# Patient Record
Sex: Female | Born: 1970 | Race: Black or African American | Hispanic: No | Marital: Married | Smoking: Former smoker
Health system: Southern US, Community
[De-identification: ages and names within clinical notes are randomized; demographics above are authoritative.]

## PROBLEM LIST (undated history)

## (undated) DIAGNOSIS — G43909 Migraine, unspecified, not intractable, without status migrainosus: Secondary | ICD-10-CM

## (undated) DIAGNOSIS — IMO0002 Reserved for concepts with insufficient information to code with codable children: Secondary | ICD-10-CM

## (undated) DIAGNOSIS — I10 Essential (primary) hypertension: Secondary | ICD-10-CM

## (undated) DIAGNOSIS — C801 Malignant (primary) neoplasm, unspecified: Secondary | ICD-10-CM

## (undated) DIAGNOSIS — N289 Disorder of kidney and ureter, unspecified: Secondary | ICD-10-CM

## (undated) DIAGNOSIS — R011 Cardiac murmur, unspecified: Secondary | ICD-10-CM

## (undated) HISTORY — PX: APPENDECTOMY: SHX54

## (undated) HISTORY — PX: CARPAL TUNNEL RELEASE: SHX101

---

## 2005-04-20 ENCOUNTER — Ambulatory Visit: Payer: Self-pay | Admitting: General Practice

## 2009-05-16 ENCOUNTER — Emergency Department: Payer: Self-pay | Admitting: Emergency Medicine

## 2010-09-20 ENCOUNTER — Emergency Department: Payer: Self-pay | Admitting: Emergency Medicine

## 2013-10-13 ENCOUNTER — Observation Stay: Payer: Self-pay | Admitting: Surgery

## 2013-10-13 LAB — CBC WITH DIFFERENTIAL/PLATELET
Basophil #: 0.1 10*3/uL (ref 0.0–0.1)
Basophil %: 0.8 %
EOS PCT: 3.3 %
Eosinophil #: 0.3 10*3/uL (ref 0.0–0.7)
HCT: 41.3 % (ref 35.0–47.0)
HGB: 13.2 g/dL (ref 12.0–16.0)
Lymphocyte #: 3.3 10*3/uL (ref 1.0–3.6)
Lymphocyte %: 40.2 %
MCH: 22.1 pg — ABNORMAL LOW (ref 26.0–34.0)
MCHC: 31.9 g/dL — ABNORMAL LOW (ref 32.0–36.0)
MCV: 69 fL — ABNORMAL LOW (ref 80–100)
MONO ABS: 0.7 x10 3/mm (ref 0.2–0.9)
MONOS PCT: 8.2 %
NEUTROS ABS: 3.9 10*3/uL (ref 1.4–6.5)
Neutrophil %: 47.5 %
Platelet: 159 10*3/uL (ref 150–440)
RBC: 5.97 10*6/uL — ABNORMAL HIGH (ref 3.80–5.20)
RDW: 13.9 % (ref 11.5–14.5)
WBC: 8.2 10*3/uL (ref 3.6–11.0)

## 2013-10-13 LAB — URINALYSIS, COMPLETE
BACTERIA: NONE SEEN
BILIRUBIN, UR: NEGATIVE
Blood: NEGATIVE
GLUCOSE, UR: NEGATIVE mg/dL (ref 0–75)
Ketone: NEGATIVE
Leukocyte Esterase: NEGATIVE
NITRITE: NEGATIVE
PH: 7 (ref 4.5–8.0)
Protein: NEGATIVE
RBC,UR: NONE SEEN /HPF (ref 0–5)
Specific Gravity: 1.01 (ref 1.003–1.030)
Squamous Epithelial: 2
WBC UR: <1 /HPF (ref 0–5)

## 2013-10-13 LAB — COMPREHENSIVE METABOLIC PANEL
ALBUMIN: 3.5 g/dL (ref 3.4–5.0)
ALT: 31 U/L (ref 12–78)
ANION GAP: 7 (ref 7–16)
Alkaline Phosphatase: 76 U/L
BUN: 18 mg/dL (ref 7–18)
Bilirubin,Total: 0.3 mg/dL (ref 0.2–1.0)
CALCIUM: 8.4 mg/dL — AB (ref 8.5–10.1)
CO2: 29 mmol/L (ref 21–32)
Chloride: 103 mmol/L (ref 98–107)
Creatinine: 1.88 mg/dL — ABNORMAL HIGH (ref 0.60–1.30)
EGFR (Non-African Amer.): 32 — ABNORMAL LOW
GFR CALC AF AMER: 37 — AB
Glucose: 101 mg/dL — ABNORMAL HIGH (ref 65–99)
Osmolality: 280 (ref 275–301)
Potassium: 3.5 mmol/L (ref 3.5–5.1)
SGOT(AST): 37 U/L (ref 15–37)
Sodium: 139 mmol/L (ref 136–145)
Total Protein: 7.1 g/dL (ref 6.4–8.2)

## 2013-10-13 LAB — LIPASE, BLOOD: LIPASE: 116 U/L (ref 73–393)

## 2013-10-14 LAB — CBC WITH DIFFERENTIAL/PLATELET
BASOS PCT: 0.6 %
Basophil #: 0 10*3/uL (ref 0.0–0.1)
Eosinophil #: 0.1 10*3/uL (ref 0.0–0.7)
Eosinophil %: 2.4 %
HCT: 38.9 % (ref 35.0–47.0)
HGB: 12.2 g/dL (ref 12.0–16.0)
LYMPHS ABS: 2.1 10*3/uL (ref 1.0–3.6)
Lymphocyte %: 37.9 %
MCH: 22.1 pg — ABNORMAL LOW (ref 26.0–34.0)
MCHC: 31.4 g/dL — ABNORMAL LOW (ref 32.0–36.0)
MCV: 70 fL — ABNORMAL LOW (ref 80–100)
Monocyte #: 0.5 x10 3/mm (ref 0.2–0.9)
Monocyte %: 8.5 %
Neutrophil #: 2.7 10*3/uL (ref 1.4–6.5)
Neutrophil %: 50.6 %
Platelet: 128 10*3/uL — ABNORMAL LOW (ref 150–440)
RBC: 5.53 10*6/uL — ABNORMAL HIGH (ref 3.80–5.20)
RDW: 13.9 % (ref 11.5–14.5)
WBC: 5.4 10*3/uL (ref 3.6–11.0)

## 2013-10-14 LAB — BASIC METABOLIC PANEL
ANION GAP: 5 — AB (ref 7–16)
BUN: 18 mg/dL (ref 7–18)
CO2: 31 mmol/L (ref 21–32)
Calcium, Total: 8 mg/dL — ABNORMAL LOW (ref 8.5–10.1)
Chloride: 103 mmol/L (ref 98–107)
Creatinine: 1.79 mg/dL — ABNORMAL HIGH (ref 0.60–1.30)
EGFR (African American): 40 — ABNORMAL LOW
EGFR (Non-African Amer.): 34 — ABNORMAL LOW
Glucose: 108 mg/dL — ABNORMAL HIGH (ref 65–99)
Osmolality: 280 (ref 275–301)
Potassium: 3.7 mmol/L (ref 3.5–5.1)
Sodium: 139 mmol/L (ref 136–145)

## 2013-10-16 LAB — PATHOLOGY REPORT

## 2014-03-26 ENCOUNTER — Emergency Department: Payer: Self-pay | Admitting: Emergency Medicine

## 2014-03-26 LAB — COMPREHENSIVE METABOLIC PANEL
ALK PHOS: 90 U/L
ALT: 21 U/L
ANION GAP: 7 (ref 7–16)
Albumin: 3.7 g/dL (ref 3.4–5.0)
BILIRUBIN TOTAL: 0.2 mg/dL (ref 0.2–1.0)
BUN: 18 mg/dL (ref 7–18)
CALCIUM: 8.4 mg/dL — AB (ref 8.5–10.1)
CO2: 34 mmol/L — AB (ref 21–32)
CREATININE: 1.79 mg/dL — AB (ref 0.60–1.30)
Chloride: 99 mmol/L (ref 98–107)
GFR CALC AF AMER: 40 — AB
GFR CALC NON AF AMER: 33 — AB
Glucose: 96 mg/dL (ref 65–99)
OSMOLALITY: 281 (ref 275–301)
Potassium: 3.4 mmol/L — ABNORMAL LOW (ref 3.5–5.1)
SGOT(AST): 21 U/L (ref 15–37)
Sodium: 140 mmol/L (ref 136–145)
Total Protein: 7.4 g/dL (ref 6.4–8.2)

## 2014-03-26 LAB — CBC WITH DIFFERENTIAL/PLATELET
BASOS ABS: 0 10*3/uL (ref 0.0–0.1)
Basophil %: 0.6 %
EOS PCT: 1.4 %
Eosinophil #: 0.1 10*3/uL (ref 0.0–0.7)
HCT: 42.6 % (ref 35.0–47.0)
HGB: 13.4 g/dL (ref 12.0–16.0)
Lymphocyte #: 3.8 10*3/uL — ABNORMAL HIGH (ref 1.0–3.6)
Lymphocyte %: 50.1 %
MCH: 22.4 pg — AB (ref 26.0–34.0)
MCHC: 31.5 g/dL — AB (ref 32.0–36.0)
MCV: 71 fL — ABNORMAL LOW (ref 80–100)
MONOS PCT: 8.7 %
Monocyte #: 0.7 x10 3/mm (ref 0.2–0.9)
NEUTROS PCT: 39.2 %
Neutrophil #: 3 10*3/uL (ref 1.4–6.5)
Platelet: 163 10*3/uL (ref 150–440)
RBC: 6.01 10*6/uL — ABNORMAL HIGH (ref 3.80–5.20)
RDW: 13.5 % (ref 11.5–14.5)
WBC: 7.6 10*3/uL (ref 3.6–11.0)

## 2014-03-26 LAB — URINALYSIS, COMPLETE
BLOOD: NEGATIVE
Bacteria: NONE SEEN
Bilirubin,UR: NEGATIVE
Glucose,UR: NEGATIVE mg/dL (ref 0–75)
KETONE: NEGATIVE
Leukocyte Esterase: NEGATIVE
NITRITE: NEGATIVE
Ph: 6 (ref 4.5–8.0)
Protein: NEGATIVE
RBC, UR: NONE SEEN /HPF (ref 0–5)
Specific Gravity: 1.011 (ref 1.003–1.030)
Squamous Epithelial: 2
WBC UR: NONE SEEN /HPF (ref 0–5)

## 2014-03-27 LAB — GC/CHLAMYDIA PROBE AMP

## 2014-03-27 LAB — WET PREP, GENITAL

## 2014-09-12 NOTE — Op Note (Signed)
PATIENT NAMJeannie Little:  Johannes, Camren L MR#:  [id number] DATE OF BIRTH:  1970/12/17  DATE OF PROCEDURE:  10/14/2013  PREOPERATIVE DIAGNOSIS: Acute appendicitis.   POSTOPERATIVE DIAGNOSIS: Acute appendicitis.   PROCEDURE: Laparoscopic appendectomy.   SURGEON: Richard E. Excell Seltzerooper, M.D.   ANESTHESIA: General with endotracheal tube.   INDICATIONS: This is a patient with progressive right lower quadrant pain and tenderness with signs of peritoneal irritation. Preoperatively, we discussed rationale for surgery, the options of observation, risk of bleeding, infection, negative laparoscopy and open procedure and alternative causes. This was all reviewed for her. She understood and agreed to proceed.   FINDINGS: Acute appendicitis in a retrocecal position. The appendix was deeply invested within the peritoneal reflection of the right colon but was obviously inflamed.   DESCRIPTION OF PROCEDURE: The patient was induced to general anesthesia, given IV antibiotics. VTE prophylaxis was in place. She was prepped and draped in a sterile fashion. A Foley catheter had been placed as well. Marcaine was infiltrated in skin and subcutaneous tissues around the periumbilical area.   Incision was made. Veress needle was placed. Pneumoperitoneum was obtained and a 5 mm trocar port was placed. The abdominal cavity was explored under direct vision and a 12 mm left lateral port was placed and a 5 mm suprapubic port was placed. The appendix could not be easily identified but the base of the appendix was identified ultimately at the cecal appendiceal juncture and it was noted that there was inflammation on the lateral side of the cecum suggestive of a retrocecal appendix with appendicitis. Sharp dissection was performed to take down thick investing peritoneal reflection of the right colon, allowing for visualization of an inflamed and indurated appendix. The base of the appendix was divided with a standard load Endo GIA and then 2 firings  of a vascular load Endo GIA was utilized across the mesoappendix and it was passed out through the lateral port site with the aid of an Endo Catch bag. It was opened and inspected and found to be acute appendicitis.   The area was checked for hemostasis and found to be adequate. Irrigation was performed and again hemostasis was found to be adequate. Therefore at this point, the left lateral port site was closed with multiple simple sutures of 0 Vicryl utilizing an Endo Close technique under direct vision. Then again hemostasis checked and found to be adequate. Pneumoperitoneum was released. All ports were removed. A 4-0 subcuticular Monocryl was used on all skin edges. Steri-Strips, Mastisol and sterile dressings were placed. The patient tolerated the procedure well. There were no complications. She was taken to the recovery room in stable condition to be admitted for continued care.    ____________________________ Adah Salvageichard E. Excell Seltzerooper, MD rec:cs D: 10/14/2013 15:57:55 ET T: 10/14/2013 19:13:04 ET JOB#: [id number]  cc: Adah Salvageichard E. Excell Seltzerooper, MD, <Dictator> Lattie HawICHARD E COOPER MD ELECTRONICALLY SIGNED 10/15/2013 7:45

## 2014-09-12 NOTE — H&P (Signed)
PATIENT NAME:  Becky Little, Becky Little MR#:  [id number] DATE OF BIRTH:  [birth year]  DATE OF ADMISSION:  10/13/2013  [facility]   ADMITTING PHYSICIAN:  Dr. Michela PitcherEly.   CHIEF COMPLAINT:  Abdominal pain.   BRIEF HISTORY:  Becky Little is a 44 year old woman seen in the Emergency Room with approximately a 10-hour history of abdominal pain. She noticed some slight right lower quadrant abdominal pain yesterday, but had no problems over the course of the day. She ate normally at lunch today. Approximately 2:00 to 3:00 in the afternoon, she developed sudden onset of pain in the right lower quadrant, which did not radiate. It was not accompanied by any nausea or vomiting. She had no fever or chills. The pain became so severe she presented to the Emergency Room for further evaluation. She denies any nausea, vomiting, diarrhea, or constipation. She had a similar episode of pain 3 years ago, [facility]. The pain resolved spontaneously, and they did not have a diagnosis for her at that time. She denies any other significant GI problems. Specifically, she has no history of hepatitis, yellow jaundice, pancreatitis, peptic ulcer disease, gallbladder disease, or diverticulitis. She has had no abdominal surgery. She denies any GYN symptoms. She has no cardiac history of note. She is hypertensive and denies diabetes or hypothyroidism. She has a history of depression and migraine headaches. 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 [facility].   ALLERGIES:  She has no medical allergies.  MEDICATIONS:  Include BuSpar 150 mg/12 hours once a day, divalproex 1000 mg once a day, iron 325, omeprazole 20 mg once a day, propranolol 80 mg once a day, and Simvastatin 10 mg once a day.   SOCIAL HISTORY:  She is not a cigarette smoker, but  did smoke formerly. She does not drink alcohol regularly.   PHYSICAL EXAMINATION:  GENERAL:  She is an alert, pleasant woman accompanied by significant other.  VITAL SIGNS:  Stable. Blood pressure 112/60, heart rate 74 and regular. She is afebrile.  HEENT:  There is no scleral icterus. No pupillary abnormalities. No facial deformities.  NECK:  Supple, nontender, with normal midline trachea. No adenopathy.  CHEST:  Clear with no adventitious sounds, and she has normal pulmonary excursion. She does have some pleuritic chest pain.  CARDIAC:  No murmurs or gallops to my ear, and seems to be in normal sinus rhythm.  ABDOMEN:  Essentially soft with no significant point tenderness. She has some very mild tenderness in the right lower quadrant with no guarding or rebound. She has active bowel sounds. No hernias are noted.  EXTREMITIES:  Full range of motion. No deformities.  PSYCHIATRIC:  Normal orientation, normal affect.   ASSESSMENT AND PLAN:  CT scan demonstrated an upper limit of normal sized appendix with possible stranding consistent with possible early appendicitis.I have independently reviewed her CT scan and the report. She does not have an elevated white blood cell count, and her clinical picture at 10 hours does not suggest acute appendicitis. She has been given antibiotics in the Emergency Room. We will admit her to the hospital, place her on IV rehydration, evaluate her continued creatinine elevation, and do serial examinations. We will re-evaluate her for laparoscopy in the morning. This plan has been discussed with the patient, and she is in agreement.  ____________________________ Carmie End, MD rle:ms D: 10/13/2013 21:28:55 ET T: 10/13/2013 22:25:52 ET JOB#: [id number]  cc: Carmie End, MD, <Dictator> [facility] Quentin Ore MD ELECTRONICALLY SIGNED 10/14/2013 20:33

## 2016-01-23 ENCOUNTER — Emergency Department
Admission: EM | Admit: 2016-01-23 | Discharge: 2016-01-23 | Disposition: A | Discharge status: Home / Self Care | Payer: Medicare Other | Attending: Emergency Medicine | Admitting: Emergency Medicine

## 2016-01-23 ENCOUNTER — Emergency Department: Payer: Medicare Other

## 2016-01-23 DIAGNOSIS — I10 Essential (primary) hypertension: Secondary | ICD-10-CM | POA: Insufficient documentation

## 2016-01-23 DIAGNOSIS — S46911A Strain of unspecified muscle, fascia and tendon at shoulder and upper arm level, right arm, initial encounter: Secondary | ICD-10-CM | POA: Diagnosis not present

## 2016-01-23 DIAGNOSIS — Y939 Activity, unspecified: Secondary | ICD-10-CM | POA: Diagnosis not present

## 2016-01-23 DIAGNOSIS — F17200 Nicotine dependence, unspecified, uncomplicated: Secondary | ICD-10-CM | POA: Diagnosis not present

## 2016-01-23 DIAGNOSIS — Y929 Unspecified place or not applicable: Secondary | ICD-10-CM | POA: Insufficient documentation

## 2016-01-23 DIAGNOSIS — W06XXXA Fall from bed, initial encounter: Secondary | ICD-10-CM | POA: Insufficient documentation

## 2016-01-23 DIAGNOSIS — Y999 Unspecified external cause status: Secondary | ICD-10-CM | POA: Insufficient documentation

## 2016-01-23 DIAGNOSIS — S4991XA Unspecified injury of right shoulder and upper arm, initial encounter: Secondary | ICD-10-CM | POA: Diagnosis present

## 2016-01-23 DIAGNOSIS — S5001XA Contusion of right elbow, initial encounter: Secondary | ICD-10-CM | POA: Insufficient documentation

## 2016-01-23 HISTORY — DX: Migraine, unspecified, not intractable, without status migrainosus: G43.909

## 2016-01-23 HISTORY — DX: Reserved for concepts with insufficient information to code with codable children: IMO0002

## 2016-01-23 HISTORY — DX: Disorder of kidney and ureter, unspecified: N28.9

## 2016-01-23 HISTORY — DX: Essential (primary) hypertension: I10

## 2016-01-23 HISTORY — DX: Cardiac murmur, unspecified: R01.1

## 2016-01-23 NOTE — ED Provider Notes (Signed)
[facility] Emergency Department Provider Note ____________________________________________  Time seen: 1620  I have reviewed the triage vital signs and the nursing notes.  HISTORY  Chief Complaint  Arm Injury  HPI Becky Little is a 45 y.o. female right-hand dominant, presents to the ED for moderate pain to the right elbow and stiffness the shoulder. Patient describes rolled out of bed and falling to the floor just prior to arrival, but around a half earlier. She denies any other injury at this time including head injury, or loss of consciousness. She describes some tenderness to the broadside of the elbow as well as some stiffness to the right shoulder.  Past Medical History:  Diagnosis Date  . Complex regional pain syndrome   . Hypertension   . Migraine   . Murmur   . Renal disorder    stage 3 renal failure    There are no active problems to display for this patient.   Past Surgical History:  Procedure Laterality Date  . CARPAL TUNNEL RELEASE      Prior to Admission medications   Not on File   Allergies Review of patient's allergies indicates no known allergies.  No family history on file.  Social History Social History  Substance Use Topics  . Smoking status: Current Every Day Smoker    Packs/day: 0.50  . Smokeless tobacco: Never Used  . Alcohol use Not on file    Review of Systems  Constitutional: Negative for fever. Musculoskeletal: Negative for back pain. Right shoulder stiffness and elbow pain as above. Skin: Negative for rash. Neurological: Negative for headaches, focal weakness or numbness. ____________________________________________  PHYSICAL EXAM:  VITAL SIGNS: ED Triage Vitals [01/23/16 1607]  Enc Vitals Group     BP (!) 132/47     Pulse Rate 61     Resp 18     Temp 98 F (36.7 C)     Temp Source Oral     SpO2 100 %     Weight 220 lb (99.8 kg)     Height 5\' 3"  (1.6 m)     Head Circumference      Peak Flow       Pain Score 6     Pain Loc      Pain Edu?      Excl. in GC?    Constitutional: Alert and oriented. Well appearing and in no distress. Head: Normocephalic and atraumatic. Cardiovascular: Normal distal pulses Respiratory: Normal respiratory effort.  Musculoskeletal: Right upper extremity without obvious deformity, dislocation, effusion, or edema. Patient with normal elbow range of motion on exam. Normal pronation and supination distally. Patient is with only minimal tenderness to palpation to the olecranon process. She is able to demonstrate normal rotator cuff resistance testing and full active shoulder range of motion. Nontender with normal range of motion in all extremities.  Neurologic: Nerves II through XII grossly intact. Normal intrinsic and opposition testing. Normal gait without ataxia. Normal speech and language. No gross focal neurologic deficits are appreciated. Skin:  Skin is warm, dry and intact. No rash noted. No abrasion, erythema, induration, swelling, or laceration is noted. ____________________________________________   RADIOLOGY  Right Elbow IMPRESSION: No acute bony findings or joint effusion. ____________________________________________  INITIAL IMPRESSION / ASSESSMENT AND PLAN / ED COURSE  Patient with a minor elbow contusion and shoulder strain after a fall out of the bed. No radiologic evidence of fracture or dislocation. An no physical exam findings of an acute internal derangement. She is discharged  with instructions to dose her home pain medicines which include Tylenol and muscle relaxants. She should follow with the [facility] for ongoing treatment and evaluation  Clinical Course   ____________________________________________  FINAL CLINICAL IMPRESSION(S) / ED DIAGNOSES  Final diagnoses:  Elbow contusion, right, initial encounter  Shoulder strain, right, initial encounter     Lissa Hoard, PA-C 01/23/16 1659    Phineas Semen,  MD 01/23/16 [phone]

## 2016-01-23 NOTE — Discharge Instructions (Signed)
Your exam and x-ray are negative today. Take your home meds as needed for pain relief. Apply ice to reduce symptoms. [facility].

## 2017-04-04 ENCOUNTER — Encounter: Payer: Self-pay | Admitting: Emergency Medicine

## 2017-04-04 ENCOUNTER — Emergency Department
Admission: EM | Admit: 2017-04-04 | Discharge: 2017-04-04 | Disposition: A | Discharge status: Home / Self Care | Payer: Medicare Other | Attending: Student in an Organized Health Care Education/Training Program | Admitting: Student in an Organized Health Care Education/Training Program

## 2017-04-04 DIAGNOSIS — I10 Essential (primary) hypertension: Secondary | ICD-10-CM | POA: Insufficient documentation

## 2017-04-04 DIAGNOSIS — M7918 Myalgia, other site: Secondary | ICD-10-CM | POA: Diagnosis present

## 2017-04-04 DIAGNOSIS — Z87891 Personal history of nicotine dependence: Secondary | ICD-10-CM | POA: Insufficient documentation

## 2017-04-04 MED ORDER — CYCLOBENZAPRINE HCL 5 MG PO TABS: 5.0000 mg | ORAL_TABLET | Freq: Three times a day (TID) | ORAL | 0 refills | Status: AC | PRN

## 2017-04-04 NOTE — Discharge Instructions (Signed)
Your exam is essentially normal following your exam. Take the muscle relaxant as needed, with OTC Tylenol or Motrin. Follow-up with your provider for continued symptoms.
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
## 2021-03-29 ENCOUNTER — Emergency Department (HOSPITAL_COMMUNITY)
Admission: EM | Admit: 2021-03-29 | Discharge: 2021-03-29 | Disposition: A | Discharge status: Home / Self Care | Payer: No Typology Code available for payment source | Attending: Emergency Medicine | Admitting: Emergency Medicine

## 2021-03-29 ENCOUNTER — Emergency Department (HOSPITAL_COMMUNITY): Payer: No Typology Code available for payment source

## 2021-03-29 DIAGNOSIS — F444 Conversion disorder with motor symptom or deficit: Secondary | ICD-10-CM | POA: Diagnosis present

## 2021-03-29 DIAGNOSIS — N183 Chronic kidney disease, stage 3 unspecified: Secondary | ICD-10-CM | POA: Insufficient documentation

## 2021-03-29 DIAGNOSIS — R519 Headache, unspecified: Secondary | ICD-10-CM | POA: Diagnosis not present

## 2021-03-29 DIAGNOSIS — Z20822 Contact with and (suspected) exposure to covid-19: Secondary | ICD-10-CM | POA: Insufficient documentation

## 2021-03-29 DIAGNOSIS — R29818 Other symptoms and signs involving the nervous system: Secondary | ICD-10-CM

## 2021-03-29 DIAGNOSIS — R2990 Unspecified symptoms and signs involving the nervous system: Secondary | ICD-10-CM

## 2021-03-29 DIAGNOSIS — W19XXXA Unspecified fall, initial encounter: Secondary | ICD-10-CM | POA: Diagnosis not present

## 2021-03-29 DIAGNOSIS — I129 Hypertensive chronic kidney disease with stage 1 through stage 4 chronic kidney disease, or unspecified chronic kidney disease: Secondary | ICD-10-CM | POA: Insufficient documentation

## 2021-03-29 DIAGNOSIS — R791 Abnormal coagulation profile: Secondary | ICD-10-CM | POA: Insufficient documentation

## 2021-03-29 DIAGNOSIS — R531 Weakness: Secondary | ICD-10-CM | POA: Diagnosis not present

## 2021-03-29 DIAGNOSIS — Z87891 Personal history of nicotine dependence: Secondary | ICD-10-CM | POA: Diagnosis not present

## 2021-03-29 DIAGNOSIS — I639 Cerebral infarction, unspecified: Secondary | ICD-10-CM

## 2021-03-29 LAB — COMPREHENSIVE METABOLIC PANEL
ALT: 27 U/L (ref 0–44)
AST: 21 U/L (ref 15–41)
Albumin: 3.1 g/dL — ABNORMAL LOW (ref 3.5–5.0)
Alkaline Phosphatase: 90 U/L (ref 38–126)
Anion gap: 7 (ref 5–15)
BUN: 18 mg/dL (ref 6–20)
CO2: 26 mmol/L (ref 22–32)
Calcium: 8 mg/dL — ABNORMAL LOW (ref 8.9–10.3)
Chloride: 105 mmol/L (ref 98–111)
Creatinine, Ser: 2 mg/dL — ABNORMAL HIGH (ref 0.44–1.00)
GFR, Estimated: 30 mL/min — ABNORMAL LOW (ref >60)
Glucose, Bld: 94 mg/dL (ref 70–99)
Potassium: 3.2 mmol/L — ABNORMAL LOW (ref 3.5–5.1)
Sodium: 138 mmol/L (ref 135–145)
Total Bilirubin: 0.8 mg/dL (ref 0.3–1.2)
Total Protein: 5.7 g/dL — ABNORMAL LOW (ref 6.5–8.1)

## 2021-03-29 LAB — DIFFERENTIAL
Abs Immature Granulocytes: 0.01 10*3/uL (ref 0.00–0.07)
Basophils Absolute: 0 10*3/uL (ref 0.0–0.1)
Basophils Relative: 0 %
Eosinophils Absolute: 0.1 10*3/uL (ref 0.0–0.5)
Eosinophils Relative: 2 %
Immature Granulocytes: 0 %
Lymphocytes Relative: 43 %
Lymphs Abs: 2.1 10*3/uL (ref 0.7–4.0)
Monocytes Absolute: 0.4 10*3/uL (ref 0.1–1.0)
Monocytes Relative: 9 %
Neutro Abs: 2.2 10*3/uL (ref 1.7–7.7)
Neutrophils Relative %: 46 %

## 2021-03-29 LAB — CBC
HCT: 40.3 % (ref 36.0–46.0)
Hemoglobin: 12.2 g/dL (ref 12.0–15.0)
MCH: 21.4 pg — ABNORMAL LOW (ref 26.0–34.0)
MCHC: 30.3 g/dL (ref 30.0–36.0)
MCV: 70.8 fL — ABNORMAL LOW (ref 80.0–100.0)
Platelets: 112 10*3/uL — ABNORMAL LOW (ref 150–400)
RBC: 5.69 MIL/uL — ABNORMAL HIGH (ref 3.87–5.11)
RDW: 13.4 % (ref 11.5–15.5)
WBC: 4.7 10*3/uL (ref 4.0–10.5)
nRBC: 0 % (ref 0.0–0.2)

## 2021-03-29 LAB — CBG MONITORING, ED: Glucose-Capillary: 74 mg/dL (ref 70–99)

## 2021-03-29 LAB — ETHANOL: Alcohol, Ethyl (B): <10 mg/dL (ref <10)

## 2021-03-29 LAB — I-STAT CHEM 8, ED
BUN: 20 mg/dL (ref 6–20)
Calcium, Ion: 1.06 mmol/L — ABNORMAL LOW (ref 1.15–1.40)
Chloride: 104 mmol/L (ref 98–111)
Creatinine, Ser: 2.1 mg/dL — ABNORMAL HIGH (ref 0.44–1.00)
Glucose, Bld: 91 mg/dL (ref 70–99)
HCT: 38 % (ref 36.0–46.0)
Hemoglobin: 12.9 g/dL (ref 12.0–15.0)
Potassium: 3.2 mmol/L — ABNORMAL LOW (ref 3.5–5.1)
Sodium: 141 mmol/L (ref 135–145)
TCO2: 25 mmol/L (ref 22–32)

## 2021-03-29 LAB — PROTIME-INR
INR: 1.1 (ref 0.8–1.2)
Prothrombin Time: 13.8 seconds (ref 11.4–15.2)

## 2021-03-29 LAB — I-STAT BETA HCG BLOOD, ED (MC, WL, AP ONLY): I-stat hCG, quantitative: <5 m[IU]/mL (ref <5)

## 2021-03-29 LAB — RESP PANEL BY RT-PCR (FLU A&B, COVID) ARPGX2
Influenza A by PCR: NEGATIVE
Influenza B by PCR: NEGATIVE
SARS Coronavirus 2 by RT PCR: NEGATIVE

## 2021-03-29 LAB — APTT: aPTT: 28 seconds (ref 24–36)

## 2021-03-29 MED ORDER — LORAZEPAM 2 MG/ML IJ SOLN: 2.0000 mg | Freq: Once | INTRAMUSCULAR | Status: AC

## 2021-03-29 MED ORDER — LORAZEPAM 2 MG/ML IJ SOLN
INTRAMUSCULAR | Status: AC
  Administered 2021-03-29: 2 mg via INTRAVENOUS
  Filled 2021-03-29: qty 1

## 2021-03-29 MED ORDER — GADOBUTROL 1 MMOL/ML IV SOLN
9.0000 mL | Freq: Once | INTRAVENOUS | Status: AC | PRN
  Administered 2021-03-29: 9 mL via INTRAVENOUS

## 2021-03-29 NOTE — Code Documentation (Signed)
Pt is a 50 yr old female with history of CKD, complex regional pain syndrome and migraine who presents to ED at 1713. Pt was last known well today at 1600 when she was heard to fall. After the fall, she was unable to move her legs, and was having difficulty speaking and following commands.EMS called, and code stroke called out. Pt cleared at bridge, CBG and blood done. Pt taken to CT at 1720. CT neg for acute hemorrhage per Dr. Iver Nestle. Pt with CKD, so CTA not obtained. Pt taken to MRI stat. MRI negative for acute stroke per Dr Iver Nestle. Pt will need q 2 hr VS and mNIHSS. Bedside handoff with ED RN complete.

## 2021-03-29 NOTE — Progress Notes (Signed)
C Collar removed without mvmt or twisting by RNs to allow pt to fit into MRI headpiece.

## 2021-03-29 NOTE — Progress Notes (Signed)
Jewelry and ear pod and clothing given pt pt's wife.

## 2021-03-29 NOTE — ED Provider Notes (Signed)
[facility] Provider Note   CSN: [id number] Arrival date & time: 03/29/21  1711  An emergency department physician performed an initial assessment on this suspected stroke patient at 1713.  History Chief complaint: Code stroke  Becky Little is a 50 y.o. female.  HPI  Patient presented to the ED for evaluation of possible stroke.  Patient resides at home with her mother.  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  According to the EMS report patient was heard apparently falling and crying out for help.  Patient was noted to have weakness in her legs and difficulty with her speech, code stroke was activated.    Patient states when she was at home she started to feel weak.  She felt like she had to grab onto something.  Patient ended up falling and was not able to get up on her own because of weakness in her arms and legs.  She did not lose consciousness.  She is not having any chest pain or shortness of breath.  No vomiting or diarrhea.  Past Medical History:  Diagnosis Date   Complex regional pain syndrome    Hypertension    Migraine    Murmur    Renal disorder    stage 3 renal failure    There are no problems to display for this patient.   Past Surgical History:  Procedure Laterality Date   CARPAL TUNNEL RELEASE       OB History   No obstetric history on file.     No family history on file.  Social History   Tobacco Use   Smoking status: Former    Packs/day: 0.50    Types: Cigarettes    Quit date: 03/04/2017    Years since quitting: 4.0   Smokeless tobacco: Never    Home Medications Prior to Admission medications   Medication Sig Start Date End Date Taking? Authorizing Provider  cyclobenzaprine (FLEXERIL) 5 MG tablet Take 1 tablet (5 mg total) 3 (three) times daily as needed by mouth for muscle spasms. 04/04/17   Menshew, Dannielle Karvonen, PA-C    Allergies    Patient has no known allergies.  Review of Systems   Review  of Systems  All other systems reviewed and are negative.  Physical Exam Updated Vital Signs BP (!) 100/58   Pulse 66   Temp 97.8 F (36.6 C) (Oral)   Resp 14   Ht 1.575 m (5\' 2" )   Wt 93.6 kg   SpO2 100%   BMI 37.75 kg/m   Physical Exam Vitals and nursing note reviewed.  Constitutional:      General: She is not in acute distress.    Appearance: She is well-developed.  HENT:     Head: Normocephalic and atraumatic.     Right Ear: External ear normal.     Left Ear: External ear normal.  Eyes:     General: No scleral icterus.       Right eye: No discharge.        Left eye: No discharge.     Conjunctiva/sclera: Conjunctivae normal.  Neck:     Trachea: No tracheal deviation.  Cardiovascular:     Rate and Rhythm: Normal rate and regular rhythm.  Pulmonary:     Effort: Pulmonary effort is normal. No respiratory distress.     Breath sounds: Normal breath sounds. No stridor. No wheezing or rales.  Abdominal:     General: Bowel sounds are normal. There  is no distension.     Palpations: Abdomen is soft.     Tenderness: There is no abdominal tenderness. There is no guarding or rebound.  Musculoskeletal:        General: No tenderness or deformity.     Cervical back: Neck supple.  Skin:    General: Skin is warm and dry.     Findings: No rash.  Neurological:     Mental Status: She is alert.     Cranial Nerves: No cranial nerve deficit (no facial droop, extraocular movements intact, no slurred speech).     Sensory: Sensory deficit present.     Motor: Weakness present. No abnormal muscle tone or seizure activity.     Coordination: Coordination normal.     Comments: Patient is having difficulty moving both arms and both legs  Psychiatric:        Mood and Affect: Mood normal.    ED Results / Procedures / Treatments   Labs (all labs ordered are listed, but only abnormal results are displayed) Labs Reviewed  CBC - Abnormal; Notable for the following components:      Result  Value   RBC 5.69 (*)    MCV 70.8 (*)    MCH 21.4 (*)    Platelets 112 (*)    All other components within normal limits  COMPREHENSIVE METABOLIC PANEL - Abnormal; Notable for the following components:   Potassium 3.2 (*)    Creatinine, Ser 2.00 (*)    Calcium 8.0 (*)    Total Protein 5.7 (*)    Albumin 3.1 (*)    GFR, Estimated 30 (*)    All other components within normal limits  I-STAT CHEM 8, ED - Abnormal; Notable for the following components:   Potassium 3.2 (*)    Creatinine, Ser 2.10 (*)    Calcium, Ion 1.06 (*)    All other components within normal limits  RESP PANEL BY RT-PCR (FLU A&B, COVID) ARPGX2  ETHANOL  PROTIME-INR  APTT  DIFFERENTIAL  RAPID URINE DRUG SCREEN, HOSP PERFORMED  URINALYSIS, ROUTINE W REFLEX MICROSCOPIC  I-STAT BETA HCG BLOOD, ED ([facility], [facility], [facility] ONLY)  CBG MONITORING, ED    EKG None  Radiology CT CERVICAL SPINE WO CONTRAST  Result Date: 03/29/2021 CLINICAL DATA:  Neck trauma, focal neuro deficit or paresthesia (Age 60-64y) fall. Fall. Bilateral leg weakness. EXAM: CT CERVICAL SPINE WITHOUT CONTRAST TECHNIQUE: Multidetector CT imaging of the cervical spine was performed without intravenous contrast. Multiplanar CT image reconstructions were also generated. COMPARISON:  None. FINDINGS: Alignment: Mild cervical spine straightening. No significant listhesis. Skull base and vertebrae: No acute fracture or suspicious osseous lesion. Soft tissues and spinal canal: No prevertebral fluid or swelling. No visible canal hematoma. Disc levels: Central disc protrusion at C4-5 and disc bulging at C5-6 and C6-7 with at least spinal stenosis. Focally advanced left-sided facet arthrosis at C4-5 resulting in mild left neural foraminal stenosis. Upper chest: Clear lung apices. Other: None. IMPRESSION: No acute cervical spine fracture. Electronically Signed   By: Logan Bores M.D.   On: 03/29/2021 18:32   MR ANGIO HEAD WO CONTRAST  Result Date: 03/29/2021 CLINICAL DATA:   Neuro deficit, stroke suspected EXAM: MRI HEAD WITHOUT CONTRAST MRA HEAD WITHOUT CONTRAST MRA NECK WITHOUT AND WITH CONTRAST TECHNIQUE: Multiplanar, multi-echo pulse sequences of the brain and surrounding structures were acquired without intravenous contrast. Angiographic images of the Circle of Willis were acquired using MRA technique without intravenous contrast. Angiographic images of the neck were acquired using  MRA technique without and with intravenous contrast. Carotid stenosis measurements (when applicable) are obtained utilizing NASCET criteria, using the distal internal carotid diameter as the denominator. CONTRAST:  67mL GADAVIST GADOBUTROL 1 MMOL/ML IV SOLN COMPARISON:  CT head 03/29/2021, no prior MRI. FINDINGS: MRI HEAD FINDINGS Evaluation is somewhat limited by motion artifact. Brain: No acute infarct, hemorrhage, mass, mass effect, or midline shift. Left cerebellar DVA. No extra-axial collection or hydrocephalus. T2 hyperintense signal in the periventricular white matter, likely the sequela of chronic small vessel ischemic disease. Vascular: Please see MRA findings below Skull and upper cervical spine: Normal marrow signal. Sinuses/Orbits: No significant sinus disease. The orbits are unremarkable. Other: Trace fluid in the bilateral mastoid air cells. MRA HEAD FINDINGS Limited by motion artifact. Evaluation is somewhat Anterior circulation: Both internal carotid arteries are patent to the termini, without stenosis or other abnormality. A1 segments patent. Normal anterior communicating artery. Poor visualization of the distal ACAs, secondary to motion. No M1 stenosis or occlusion. Normal MCA bifurcations. Imaged portions of the distal MCA branches perfused and symmetric. Posterior circulation: Vertebral arteries widely patent to the vertebrobasilar junction without stenosis. Basilar patent to its distal aspect. Superior cerebral arteries patent bilaterally. PCAs well perfused to their distal aspects  without focal stenosis. The right posterior communicating artery is visualized. Anatomic variants: None significant. MRA NECK FINDINGS Aortic arch: Standard aortic branching. No evidence of stenosis, aneurysm, or dissection. Right carotid system: No evidence of stenosis, aneurysm, or dissection. Left carotid system: No evidence of stenosis, aneurysm, or dissection. Vertebral arteries: No evidence of stenosis, dissection, or aneurysm. Other: None IMPRESSION: 1. Evaluation is somewhat limited by motion artifact. Within this limitation, no acute intracranial process. 2. No intracranial large vessel occlusion or significant stenosis. 3. No hemodynamically significant stenosis in the neck or evidence of dissection. Electronically Signed   By: Merilyn Baba M.D.   On: 03/29/2021 19:26   MR ANGIO NECK W WO CONTRAST  Result Date: 03/29/2021 CLINICAL DATA:  Neuro deficit, stroke suspected EXAM: MRI HEAD WITHOUT CONTRAST MRA HEAD WITHOUT CONTRAST MRA NECK WITHOUT AND WITH CONTRAST TECHNIQUE: Multiplanar, multi-echo pulse sequences of the brain and surrounding structures were acquired without intravenous contrast. Angiographic images of the Circle of Willis were acquired using MRA technique without intravenous contrast. Angiographic images of the neck were acquired using MRA technique without and with intravenous contrast. Carotid stenosis measurements (when applicable) are obtained utilizing NASCET criteria, using the distal internal carotid diameter as the denominator. CONTRAST:  31mL GADAVIST GADOBUTROL 1 MMOL/ML IV SOLN COMPARISON:  CT head 03/29/2021, no prior MRI. FINDINGS: MRI HEAD FINDINGS Evaluation is somewhat limited by motion artifact. Brain: No acute infarct, hemorrhage, mass, mass effect, or midline shift. Left cerebellar DVA. No extra-axial collection or hydrocephalus. T2 hyperintense signal in the periventricular white matter, likely the sequela of chronic small vessel ischemic disease. Vascular: Please see  MRA findings below Skull and upper cervical spine: Normal marrow signal. Sinuses/Orbits: No significant sinus disease. The orbits are unremarkable. Other: Trace fluid in the bilateral mastoid air cells. MRA HEAD FINDINGS Limited by motion artifact. Evaluation is somewhat Anterior circulation: Both internal carotid arteries are patent to the termini, without stenosis or other abnormality. A1 segments patent. Normal anterior communicating artery. Poor visualization of the distal ACAs, secondary to motion. No M1 stenosis or occlusion. Normal MCA bifurcations. Imaged portions of the distal MCA branches perfused and symmetric. Posterior circulation: Vertebral arteries widely patent to the vertebrobasilar junction without stenosis. Basilar patent to its distal aspect. Superior cerebral  arteries patent bilaterally. PCAs well perfused to their distal aspects without focal stenosis. The right posterior communicating artery is visualized. Anatomic variants: None significant. MRA NECK FINDINGS Aortic arch: Standard aortic branching. No evidence of stenosis, aneurysm, or dissection. Right carotid system: No evidence of stenosis, aneurysm, or dissection. Left carotid system: No evidence of stenosis, aneurysm, or dissection. Vertebral arteries: No evidence of stenosis, dissection, or aneurysm. Other: None IMPRESSION: 1. Evaluation is somewhat limited by motion artifact. Within this limitation, no acute intracranial process. 2. No intracranial large vessel occlusion or significant stenosis. 3. No hemodynamically significant stenosis in the neck or evidence of dissection. Electronically Signed   By: Wiliam Ke M.D.   On: 03/29/2021 19:26   MR BRAIN WO CONTRAST  Result Date: 03/29/2021 CLINICAL DATA:  Neuro deficit, stroke suspected EXAM: MRI HEAD WITHOUT CONTRAST MRA HEAD WITHOUT CONTRAST MRA NECK WITHOUT AND WITH CONTRAST TECHNIQUE: Multiplanar, multi-echo pulse sequences of the brain and surrounding structures were  acquired without intravenous contrast. Angiographic images of the Circle of Willis were acquired using MRA technique without intravenous contrast. Angiographic images of the neck were acquired using MRA technique without and with intravenous contrast. Carotid stenosis measurements (when applicable) are obtained utilizing NASCET criteria, using the distal internal carotid diameter as the denominator. CONTRAST:  93mL GADAVIST GADOBUTROL 1 MMOL/ML IV SOLN COMPARISON:  CT head 03/29/2021, no prior MRI. FINDINGS: MRI HEAD FINDINGS Evaluation is somewhat limited by motion artifact. Brain: No acute infarct, hemorrhage, mass, mass effect, or midline shift. Left cerebellar DVA. No extra-axial collection or hydrocephalus. T2 hyperintense signal in the periventricular white matter, likely the sequela of chronic small vessel ischemic disease. Vascular: Please see MRA findings below Skull and upper cervical spine: Normal marrow signal. Sinuses/Orbits: No significant sinus disease. The orbits are unremarkable. Other: Trace fluid in the bilateral mastoid air cells. MRA HEAD FINDINGS Limited by motion artifact. Evaluation is somewhat Anterior circulation: Both internal carotid arteries are patent to the termini, without stenosis or other abnormality. A1 segments patent. Normal anterior communicating artery. Poor visualization of the distal ACAs, secondary to motion. No M1 stenosis or occlusion. Normal MCA bifurcations. Imaged portions of the distal MCA branches perfused and symmetric. Posterior circulation: Vertebral arteries widely patent to the vertebrobasilar junction without stenosis. Basilar patent to its distal aspect. Superior cerebral arteries patent bilaterally. PCAs well perfused to their distal aspects without focal stenosis. The right posterior communicating artery is visualized. Anatomic variants: None significant. MRA NECK FINDINGS Aortic arch: Standard aortic branching. No evidence of stenosis, aneurysm, or  dissection. Right carotid system: No evidence of stenosis, aneurysm, or dissection. Left carotid system: No evidence of stenosis, aneurysm, or dissection. Vertebral arteries: No evidence of stenosis, dissection, or aneurysm. Other: None IMPRESSION: 1. Evaluation is somewhat limited by motion artifact. Within this limitation, no acute intracranial process. 2. No intracranial large vessel occlusion or significant stenosis. 3. No hemodynamically significant stenosis in the neck or evidence of dissection. Electronically Signed   By: Wiliam Ke M.D.   On: 03/29/2021 19:26   MR CERVICAL SPINE WO CONTRAST  Result Date: 03/29/2021 CLINICAL DATA:  Neck trauma EXAM: MRI CERVICAL SPINE WITHOUT CONTRAST TECHNIQUE: Multiplanar, multisequence MR imaging of the cervical spine was performed. No intravenous contrast was administered. COMPARISON:  No prior MRI, correlation is made with CT cervical spine 03/29/2021 FINDINGS: Evaluation is limited by motion artifact. Alignment: Straightening and mild reversal of the normal cervical lordosis. No significant listhesis. Vertebrae: No acute fracture or suspicious osseous lesion. Cord: Normal in signal  and morphology. Posterior Fossa, vertebral arteries, paraspinal tissues: Negative. No evidence of ligamentous injury. Disc levels: C2-C3: No significant disc bulge. Right greater than left uncovertebral hypertrophy. No spinal canal stenosis. Mild right neural foraminal narrowing. C3-C4: No significant disc bulge. No spinal canal stenosis or neural foraminal narrowing. C4-C5: Disc bulge with superimposed right paracentral protrusion, which indents the ventral cord. Mild spinal canal stenosis. Uncovertebral and facet arthropathy. Mild right and moderate left neural foraminal narrowing. C5-C6: Broad-based disc bulge with superimposed central protrusion, which indents the ventral cord. Mild spinal canal stenosis. Uncovertebral and facet arthropathy. Mild bilateral neural foraminal  narrowing. C6-C7: Mild disc bulge. No spinal canal stenosis or neural foraminal narrowing. C7-T1: No significant disc bulge. No spinal canal stenosis or neuroforaminal narrowing. IMPRESSION: 1. Evaluation is limited by motion artifact. Within this limitation, there is mild spinal canal stenosis at C4-C5 and C5-C6. No evidence of cord signal abnormality or compression. 2. Multilevel uncovertebral and facet arthropathy, which causes up to moderate left neural foraminal narrowing, at C4-C5. Mild neural foraminal narrowing at multiple levels, as described above. Electronically Signed   By: Merilyn Baba M.D.   On: 03/29/2021 19:50   CT HEAD CODE STROKE WO CONTRAST  Result Date: 03/29/2021 CLINICAL DATA:  Code stroke. Neuro deficit, acute, stroke suspected. EXAM: CT HEAD WITHOUT CONTRAST TECHNIQUE: Contiguous axial images were obtained from the base of the skull through the vertex without intravenous contrast. COMPARISON:  None. FINDINGS: Brain: There is no evidence of an acute infarct, intracranial hemorrhage, mass, midline shift, or extra-axial fluid collection. The ventricles and sulci are normal. Vascular: No hyperdense vessel. Skull: No fracture or suspicious osseous lesion. Sinuses/Orbits: Clear paranasal sinuses. Likely trace bilateral mastoid effusions. Unremarkable orbits. Other: None. ASPECTS [facility] Stroke Program Early CT Score) - Ganglionic level infarction (caudate, lentiform nuclei, internal capsule, insula, M1-M3 cortex): 7 - Supraganglionic infarction (M4-M6 cortex): 3 Total score (0-10 with 10 being normal): 10 IMPRESSION: Negative head CT.  ASPECTS of 10. Electronically Signed   By: Logan Bores M.D.   On: 03/29/2021 17:48    Procedures Procedures   Medications Ordered in ED Medications  LORazepam (ATIVAN) injection 2 mg (2 mg Intravenous Given 03/29/21 1745)  gadobutrol (GADAVIST) 1 MMOL/ML injection 9 mL (9 mLs Intravenous Contrast Given 03/29/21 1915)    ED Course  I have reviewed the  triage vital signs and the nursing notes.  Pertinent labs & imaging results that were available during my care of the patient were reviewed by me and considered in my medical decision making (see chart for details).  Clinical Course as of 03/29/21 2226  Tue Mar 29, 2021  1743 Seen on arrival by the stroke team including Dr. Curly Shores [JK]  1843 CT scan of head and C-spine without acute findings [JK]  1901 Comprehensive metabolic panel(!) Creatinine elevated compared to previous lab 7 years ago.  Patient does have history of chronic kidney disease however [JK]  1902 CBC(!) No significant abnormalities [JK]  2052 Cervical spine MRI shows no evidence of cord compression, there is some degenerative disc findings [JK]  2052 No acute findings noted the MR or MRI brain [JK]  2052 No acute abnormality noted in the MR angio of the neck [JK]  2222 Patient continues to improve.  Her strength has returned and she is moving her legs well now.  Patient feels comfortable with discharge [JK]    Clinical Course User Index [JK] Dorie Rank, MD   MDM Rules/Calculators/A&P  Patient presented to the ED for evaluation of fall and weakness.  Patient was initially activated as a code stroke.  Patient was extensively evaluated by the neurology team.  Her exam was more consistent with a functional neurologic deficit.  Patient's CT scan and MRI scans do not show any acute abnormality.  Patient is not a anemic.  There is no signs of acute electrolyte abnormalities.  No signs of infection.  Patient symptoms continued to improve while she is in the ED.  Patient states she is now moving her legs well and feels back to her baseline.  She is comfortable with discharge. Final Clinical Impression(s) / ED Diagnoses Final diagnoses:  Functional neurologic complaint  Fall, initial encounter    Rx / DC Orders ED Discharge Orders     None        Dorie Rank, MD 03/29/21 2226

## 2021-03-29 NOTE — ED Triage Notes (Signed)
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 Per mom, pt was in another room and called out that she had fallen and could not get up. Mom called EMS - on arrival at scene, pt was semi-responsive to pain and was answering questions sporadically. In ER, pt is more responsive with bilateral leg weakness and some withdrawal from painful stimuli, although she is oriented and able to answer questions. Pt's wife is at bedside and will provide additional health history information to provider.
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
## 2021-03-29 NOTE — Discharge Instructions (Signed)
The test today in the emergency room were fortunately normal.  No signs of stroke or injury to your spinal cord.  Continue your current medications.  Follow-up with your doctor to be rechecked

## 2021-03-29 NOTE — ED Notes (Signed)
Pt currently in MRI; RN unable to perform NIHSS at this time. On arrival to dept, pt was evaluated by neuro MD and NP as well as stroke response nurse. No obvious/clear neuro deficits noted although pt is occasionally slow to respond to questions and has what may be weakness in bilateral legs. A/O x 4 on arrival to dept

## 2022-02-21 IMAGING — MR MR MRA NECK WO/W CM
4 of 6 series · 18 of 48 positions shown · IV contrast (Yes GAD)
Comparison: CT head 03/29/2021, no prior MRI.

CLINICAL DATA: Neuro deficit, stroke suspected

EXAM:
MRI HEAD WITHOUT CONTRAST
MRA HEAD WITHOUT CONTRAST
MRA NECK WITHOUT AND WITH CONTRAST
TECHNIQUE: Multiplanar, multi-echo pulse sequences of the brain and surrounding
structures were acquired without intravenous contrast. Angiographic
images of the Circle of Willis were acquired using MRA technique
without intravenous contrast. Angiographic images of the neck were
acquired using MRA technique without and with intravenous contrast.
Carotid stenosis measurements (when applicable) are obtained
utilizing NASCET criteria, using the distal internal carotid
diameter as the denominator.
CONTRAST:  9mL GADAVIST GADOBUTROL 1 MMOL/ML IV SOLN

[Series 1900: cor cemra ft · coronal · 1.2mm · 0.59mm/px · 8 of 129 slices shown]
[im 1/129]
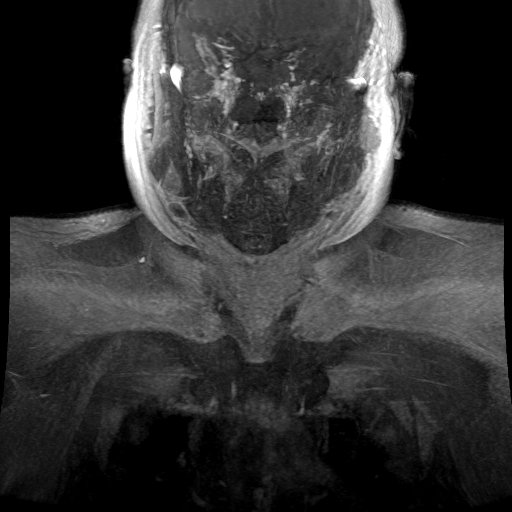
[im 19/129]
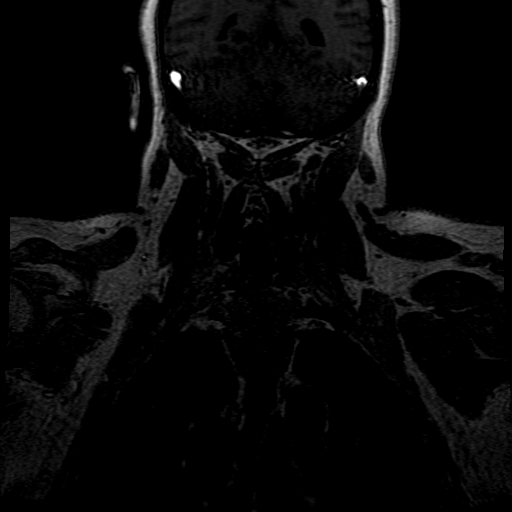
[im 37/129]
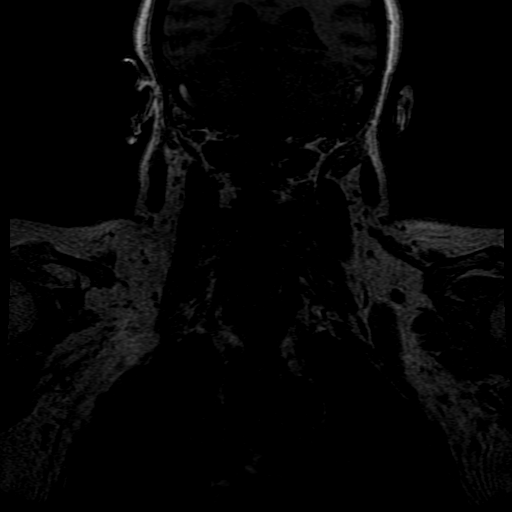
[im 55/129]
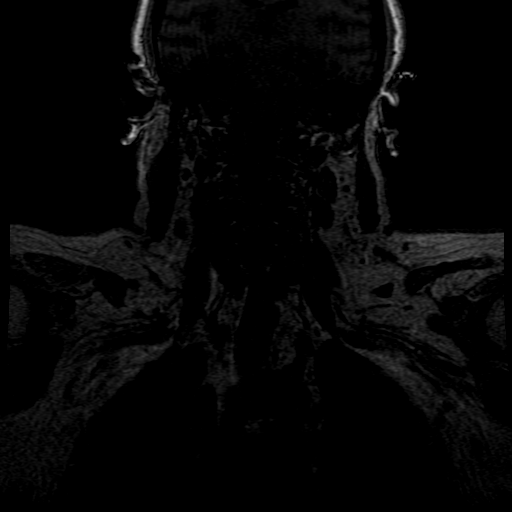
[im 74/129]
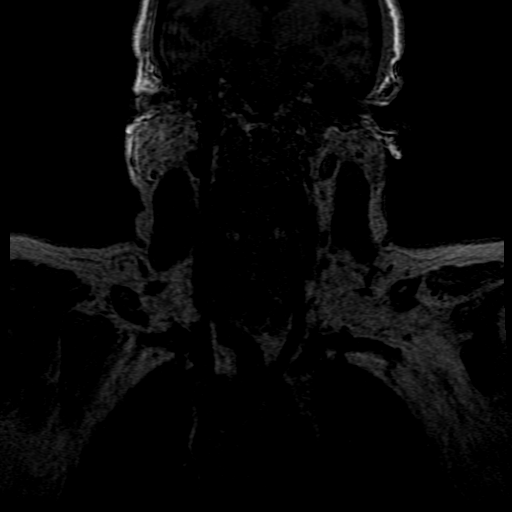
[im 92/129]
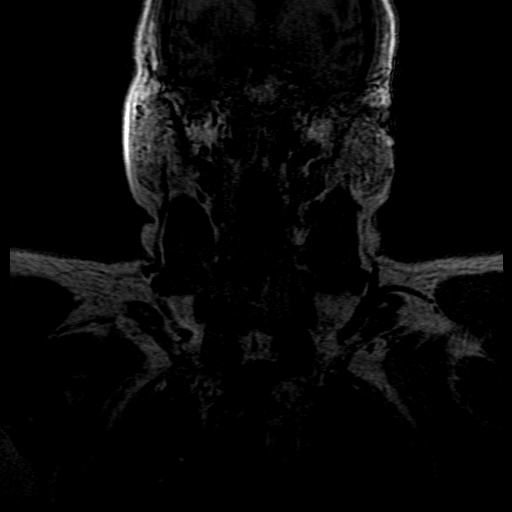
[im 110/129]
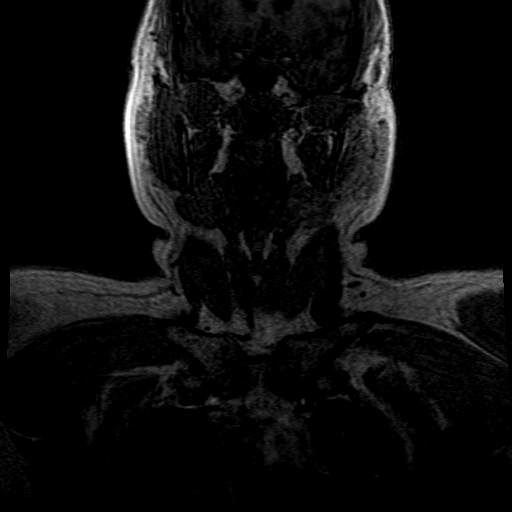
[im 129/129]
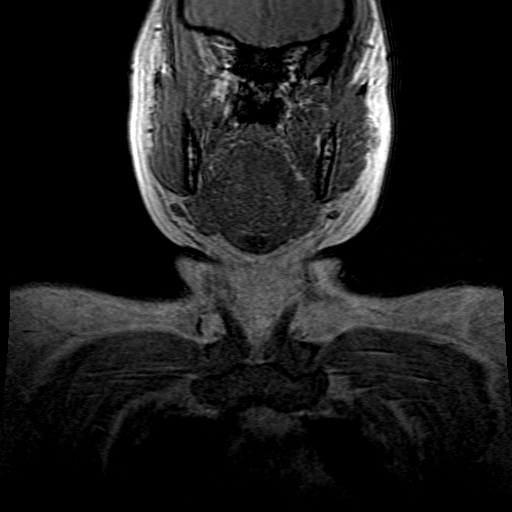

[Series 1901: ph1/cor cemra ft · coronal · 1.2mm · 0.59mm/px · 4 of 128 slices shown]
[im 1/128]
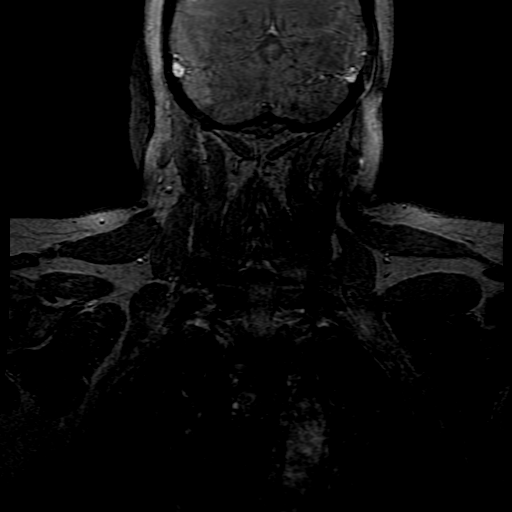
[im 19/128]
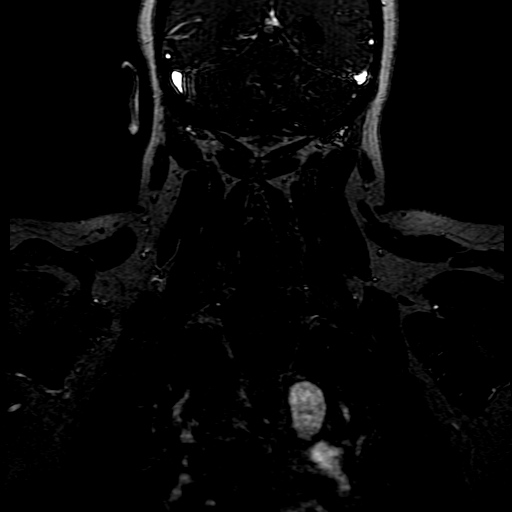
[im 73/128]
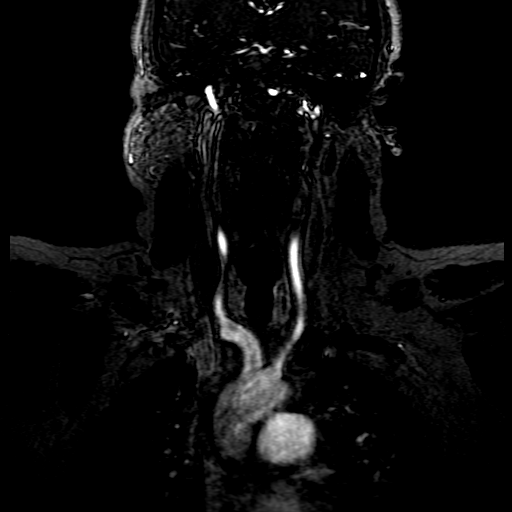
[im 109/128]
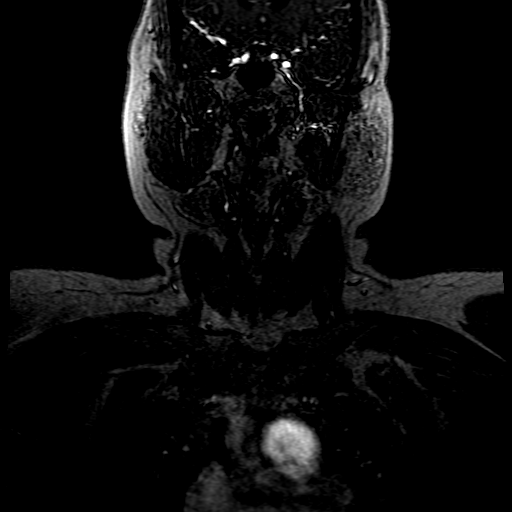

[Series 1902: ph2/cor cemra ft · coronal · 1.2mm · 0.59mm/px · 3 of 128 slices shown]
[im 19/128]
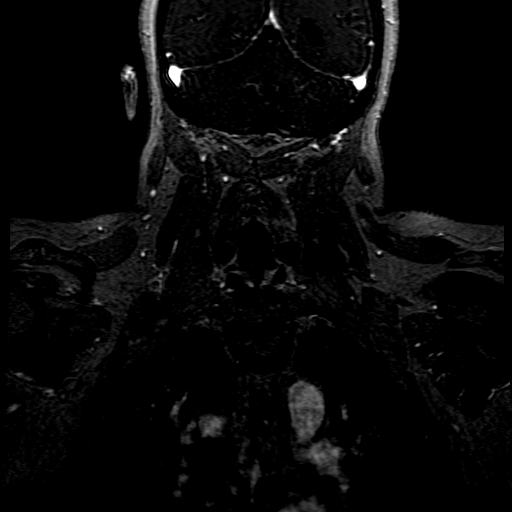
[im 73/128]
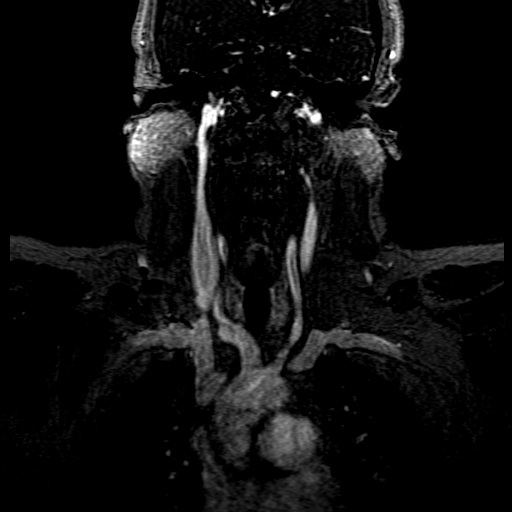
[im 109/128]
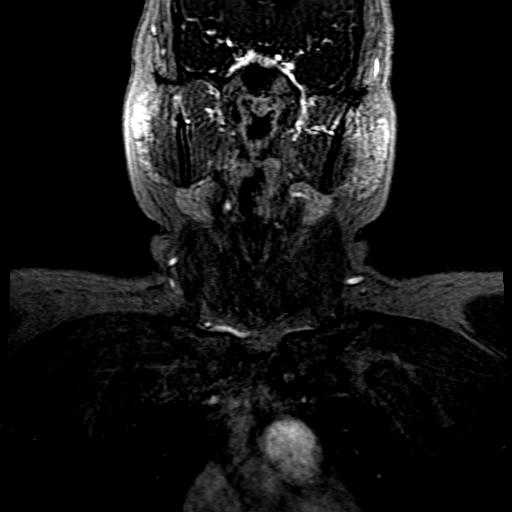

[((date))-((date)) · coronal · 1.2mm · 0.59mm/px · 3 of 129 slices shown]
[im 19/129]
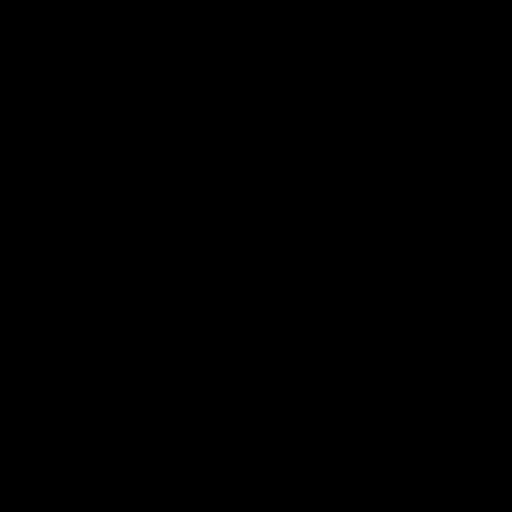
[im 74/129]
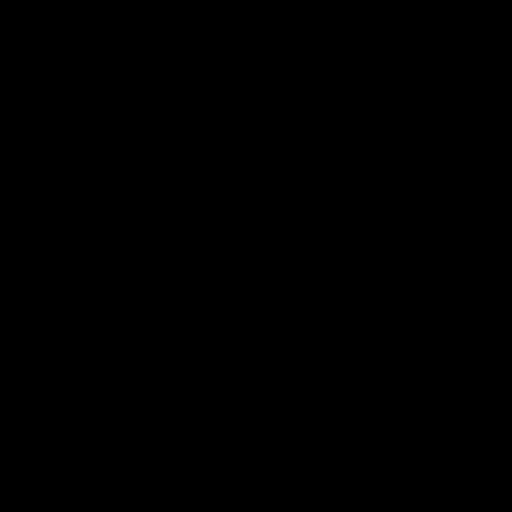
[im 110/129]
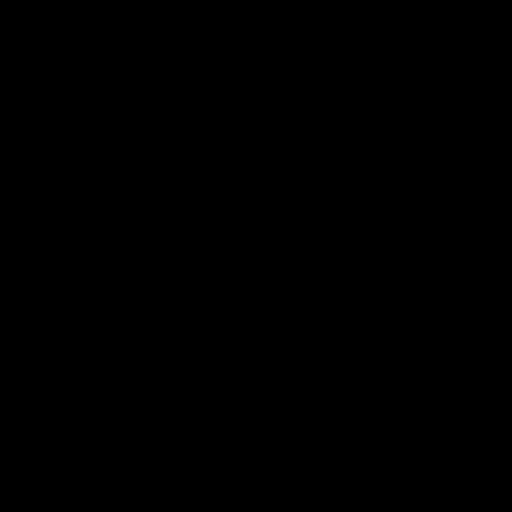

[18 of 48 positions shown; findings below may reference images not displayed]

FINDINGS: MRI HEAD FINDINGS
Evaluation is somewhat limited by motion artifact.

Brain: No acute infarct, hemorrhage, mass, mass effect, or midline
shift. Left cerebellar DVA. No extra-axial collection or
hydrocephalus. T2 hyperintense signal in the periventricular white
matter, likely the sequela of chronic small vessel ischemic disease.

Vascular: Please see MRA findings below

Skull and upper cervical spine: Normal marrow signal.

Sinuses/Orbits: No significant sinus disease. The orbits are
unremarkable.

Other: Trace fluid in the bilateral mastoid air cells.

MRA HEAD FINDINGS

Limited by motion artifact.
Evaluation is somewhat

Anterior circulation: Both internal carotid arteries are patent to
the termini, without stenosis or other abnormality. A1 segments
patent. Normal anterior communicating artery. Poor visualization of
the distal ACAs, secondary to motion. No M1 stenosis or occlusion.
Normal MCA bifurcations. Imaged portions of the distal MCA branches
perfused and symmetric.

Posterior circulation: Vertebral arteries widely patent to the
vertebrobasilar junction without stenosis. Basilar patent to its
distal aspect. Superior cerebral arteries patent bilaterally. PCAs
well perfused to their distal aspects without focal stenosis. The
right posterior communicating artery is visualized.

Anatomic variants: None significant.

MRA NECK FINDINGS

Aortic arch: Standard aortic branching. No evidence of stenosis,
aneurysm, or dissection.

Right carotid system: No evidence of stenosis, aneurysm, or
dissection.

Left carotid system: No evidence of stenosis, aneurysm, or
dissection.

Vertebral arteries: No evidence of stenosis, dissection, or
aneurysm.

Other: None
IMPRESSION: 1. Evaluation is somewhat limited by motion artifact. Within this
limitation, no acute intracranial process.
2. No intracranial large vessel occlusion or significant stenosis.
3. No hemodynamically significant stenosis in the neck or evidence
of dissection.

## 2022-12-15 ENCOUNTER — Emergency Department: Payer: No Typology Code available for payment source

## 2022-12-15 ENCOUNTER — Emergency Department
Admission: EM | Admit: 2022-12-15 | Discharge: 2022-12-15 | Disposition: A | Discharge status: Home / Self Care | Payer: No Typology Code available for payment source | Attending: Emergency Medicine | Admitting: Emergency Medicine

## 2022-12-15 ENCOUNTER — Other Ambulatory Visit: Payer: Self-pay

## 2022-12-15 DIAGNOSIS — R519 Headache, unspecified: Secondary | ICD-10-CM | POA: Insufficient documentation

## 2022-12-15 DIAGNOSIS — S161XXA Strain of muscle, fascia and tendon at neck level, initial encounter: Secondary | ICD-10-CM | POA: Diagnosis not present

## 2022-12-15 DIAGNOSIS — I10 Essential (primary) hypertension: Secondary | ICD-10-CM | POA: Insufficient documentation

## 2022-12-15 DIAGNOSIS — Y92410 Unspecified street and highway as the place of occurrence of the external cause: Secondary | ICD-10-CM | POA: Diagnosis not present

## 2022-12-15 DIAGNOSIS — S46911A Strain of unspecified muscle, fascia and tendon at shoulder and upper arm level, right arm, initial encounter: Secondary | ICD-10-CM | POA: Diagnosis not present

## 2022-12-15 DIAGNOSIS — S8012XA Contusion of left lower leg, initial encounter: Secondary | ICD-10-CM | POA: Diagnosis not present

## 2022-12-15 DIAGNOSIS — S199XXA Unspecified injury of neck, initial encounter: Secondary | ICD-10-CM | POA: Diagnosis present

## 2022-12-15 MED ORDER — BACLOFEN 10 MG PO TABS: 10.0000 mg | ORAL_TABLET | Freq: Three times a day (TID) | ORAL | 0 refills | Status: DC

## 2022-12-15 MED ORDER — BACLOFEN 10 MG PO TABS: 10.0000 mg | ORAL_TABLET | Freq: Three times a day (TID) | ORAL | 0 refills | Status: AC

## 2022-12-15 MED ORDER — OXYCODONE-ACETAMINOPHEN 5-325 MG PO TABS
1.0000 | ORAL_TABLET | Freq: Once | ORAL | Status: AC
  Administered 2022-12-15: 1 via ORAL
  Filled 2022-12-15: qty 1

## 2022-12-15 NOTE — ED Notes (Signed)
Pt to xray
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
## 2022-12-15 NOTE — ED Notes (Signed)
Pt ambulated to hall bathroom with no assistance from staff, with no issues.

## 2022-12-15 NOTE — ED Notes (Signed)
Pt gave a urine sample. POC was negative.
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
## 2024-02-19 NOTE — H&P (View-Only) (Signed)
 PASS-Perioperative Anesthesia & Surgical Screening Clinic interview ALERTS:   Sleep apnea (Advised to bring CPAP on DOS) History of anesthetic complications (Awareness under anesthesia)   Procedure:   Date/Time: 03/13/2024   Procedure: Procedure(s): THORACOSCOPY, SURGICAL; WITH REMOVAL OF A SINGLE LUNG SEGMENT(SEGMENTECTOMY) THORACOSCOPY, SURGICAL; WITH MEDIASTINAL AND REGIONAL LYMPHADENECTOMY (LIST IN ADDITION TO PRIMARY PROCEDURE)   Location: [facility]  Surgeon(s): Harpole, Alm Helayne Raddle., MD     CC/HPI  Becky Little is a 53 y.o. female who presents to the PASS (Perioperative Anesthesia & Surgical Screening) clinic for preanesthesia evaluation, to assess for potential surgical and anesthesia complications, prior to the above stated procedure.  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 Starlin.  PMH notable for HTN, HLD, OSA on CPAP, GERD, CKD, obesity, depression, PTSD, bipolar II disorder.   Vitals (36hrs): Temp:  [36.5 C (97.7 F)] 36.5 C (97.7 F) Heart Rate:  [70-71] 70 Resp:  [12-18] 18 BP: (105-117)/(52-65) 117/52 SpO2:  [100 %] 100 % Height:  [152.4 cm (5')-153.5 cm (5' 0.43)] 152.4 cm (5') Weight:  [96.7 kg (213 lb 3 oz)] 96.7 kg (213 lb 3 oz) BMI (Calculated):  [41.04-41.63] 41.63  Flowsheet Rows    Flowsheet Row Initial consult from 02/19/2024 in [facility]  BP 105/65    Relevant Risk Scores: STOP BANG: 4 [facility] Activity Status Index (DASI): 37.45  Medical History                                    HEENT Veneer in front teeth,  Partial lower back left denture.  Reading glasses  Pulmonary .  Sleep apnea (Advised to bring CPAP on DOS): CPAP, Compliant .  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  Cardiovascular  .  Exercise tolerance: >4 METS (Walks daily for 30 min.Active w ADLs and yard work. Can climb 1 FOS w/o SOB.) .  HTN: controlled  No cardiac sx reported today. Gastrointestinal .  GERD: (On no Rx. Diet controlled.), well controlled  GU/Renal .  Chronic kidney disease (Cr 2.00 on 03/29/2021. Followed at the [facility].): Stage 4 CKD   Neuro/Spine/CNS Migraine HA - managed w Propranolol. No events in a yr. Hematology/Oncology  .  Anemia (During chemo. Hgb 12.2 wnl on 03/29/21. Checking CBC today.): .  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  Obesity (BMI 41.63): Class III obesity  Musculoskeletal Normal as reviewed.  Pain Denies pain Skin  Eczema - stable. GYN  .  LMP 07/2023 after starting chemo. No LMP recorded. Infectious Disease Normal as reviewed. Psych . Bipolar disorder (Stable on Rx.) . Depression (Stable on Rx. No SI.) . Post-traumatic stress disorder (Followed by therapist at the [facility].)    Review of Systems   A comprehensive 10-system ROS was asked and was negative except for the following:  GI: reflux symptoms (diet controlled), dysphagia (solids and liquids) GU/RENAL: bladder incontinence (Urge incontinence - on Sanctura.) PSYCH: sleep disturbance (On Trazodone and Melatonin)   Past Surgical History   Past Surgical History:  Procedure Laterality Date  . ARTHROSCOPIC ROTATOR CUFF REPAIR Right 02/2023  . OTHER SURGERY Bilateral    Carpel Tunnel release    Social/Family History   Social History   Occupational History  . Occupation: Retired Administrator. Currently Unemployed  Tobacco Use  . Smoking status: Former    Current packs/day: 0.00    Average packs/day: 0.3 packs/day for 35.0 years (8.8 ttl pk-yrs)    Types: Cigarettes     Start date: 52    Quit date: 2022    Years since quitting: 3.7  . Smokeless tobacco: Never  Vaping Use  . Vaping status: Never Used  Substance and Sexual Activity  . Alcohol use: Not on file  . Drug use: Not Currently  . Sexual activity: Not Currently    Partners: Female   Vaping/E-Cigarettes  . Vaping/E-Cigarette Use Never User   . Start Date    . Cartridges/Day    . Quit Date     Family History  Problem Relation Age of Onset  . High blood pressure (Hypertension) Mother   . COPD Father   . Heart failure Father    Allergies  No Known Allergies Medications   Current Outpatient Medications  Medication  . atorvastatin (LIPITOR) 20 MG tablet  . trospium (SANCTURA) 20 mg tablet  . hydroCHLOROthiazide (HYDRODIURIL) 25 MG tablet  . lisinopriL (ZESTRIL) 40 MG tablet  . propranoloL (INDERAL) 80 MG tablet  . traZODone (DESYREL) 50 MG tablet  . calcium citrate-vitamin D3 (CITRACAL+D PETITES) 200 mg-6.25 mcg (250 unit) tablet  . hydrOXYzine HCL (ATARAX) 10 MG tablet  . ARIPiprazole (ABILIFY) 30 MG tablet  . melatonin 1 mg tablet  . cetirizine (ZYRTEC) 10 MG tablet  . cholecalciferol (VITAMIN D3) 1000 unit tablet  . buPROPion (WELLBUTRIN XL) 150 MG XL tablet   No current facility-administered medications for this visit.   Physical Exam  Airway/HEENT/Dental: Mallampati score: III. Thyromental distance is >3 FB finger breadths. Mouth opening: Normal, >2 FB. Neck ROM is Full. Dental details: Dentures. Veneer in front teeth. Partial lower back left denture.  Cardiovascular: Regular rate and rhythm. Intact distal pulses.  Pulmonary: Breath Sounds Clear. Respiratory effort: Normal.   Skin: Skin intact, warm, and dry.   Musculoskeletal: Moves all extremities. Normal strength. Functional assessment: independent. Lower extremity edema not present.  Neuro: Oriented to person, place and time. Neuro grossly intact.  Abdominal: Abdominal exam normal. Bowel sounds are normal.    Constitutional: Well-developed, well-nourished, no distress. Obese..   Test Results    No results found for: WBC, HGB, HCT, PLT, CHOL, TRIG, HDL, LDLDIRECT, ALT, AST, GLUCOSE, NA, K, CL, CALCIUM, MG, CREATININE, BUN, CO2, ALB, TSH, PSA, HGBA1C, INR, PT, APTT, VITD         Patient Instructions  Medication instructions prior to procedure:     Medication Sig INSTRUCTIONS  . ARIPiprazole (ABILIFY) 30 MG tablet Take 15 mg by mouth once daily TAKE day of procedure  . atorvastatin (LIPITOR) 20 MG tablet Take 10 mg by mouth at bedtime TAKE night before procedure  . buPROPion (WELLBUTRIN XL) 150 MG XL tablet Take 450 mg by mouth every morning TAKE day of procedure  . calcium citrate-vitamin D3 (CITRACAL+D PETITES) 200 mg-6.25 mcg (250 unit) tablet Take 1 tablet by mouth 2 (two) times daily DO NOT TAKE day of procedure  . cetirizine (ZYRTEC) 10 MG tablet Take 10 mg by mouth once daily TAKE day of procedure  . cholecalciferol (VITAMIN D3) 1000 unit tablet Take 1,000 Units  by mouth once daily DO NOT TAKE day of procedure  . hydroCHLOROthiazide (HYDRODIURIL) 25 MG tablet Take 12.5 mg by mouth once daily DO NOT TAKE day of procedure  . hydrOXYzine HCL (ATARAX) 10 MG tablet Take 10 mg by mouth 3 (three) times daily as needed TAKE day of procedure, if needed  . lisinopriL (ZESTRIL) 40 MG tablet Take 40 mg by mouth once daily for Blood Pressure DO NOT TAKE day of procedure  . melatonin 1 mg tablet Take 3 mg by mouth at bedtime TAKE night before procedure  . propranoloL (INDERAL) 80 MG tablet Take 80 mg by mouth once daily TAKE day of procedure  . traZODone (DESYREL) 50 MG tablet Take 50 mg by mouth at bedtime TAKE night before procedure  . trospium (SANCTURA) 20 mg tablet Take 20 mg by mouth 2 (two) times daily TAKE day of procedure   NOTHING TO EAT OR DRINK (except water, black tea, or black coffee) AFTER MIDNIGHT THE NIGHT BEFORE THE  PROCEDURE.  Please take the highlighted medication(s), with a glass of water on the day of the procedure up to 1 hour prior to check in time.  NO herbal supplements/multivitamins for 7 days before procedure.    Please avoid taking NSAIDs (such as ibuprofen, Advil, Motrin, Naproxen) one week prior to procedure.   Tylenol  is okay to take as needed for pain up until the morning of your procedure; do not exceed > 3 grams in 24 hours.  Please bring your inhalers/CPAP/BiPAP machine with you on day of procedure, if you use any.   Please use an antibacterial/antimicrobial soap, such as Dial, to shower the night before procedure and the day of procedure.  No ointments/creams/lotions/deodorant on the day of procedure.   Please feel free to call our clinic [phone]) if you have any questions.   Problem List   Patient Active Problem List  Diagnosis  . Malignant neoplasm of lower lobe of left lung (CMS/HHS-HCC)  . 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 Starlin.   Preoperative evaluation to rule out surgical contraindication -     Complete Blood Count (CBC); Future -     Basic Metabolic Panel (BMP); Future -     Type And Screen; Future -     ECG 12-lead  Perioperative medical risk as follows: Cardiovascular risk - DASI score: 37.45 - Good exercise tolerance & functional status. - Hypertension - stable on antihypertensives. - She is symptomatically stable from a cardiac standpoint. - Review of  EKG from 02/19/24 revealed NSR.  Pulmonary risk - OSA on CPAP: Compliant. Advised to bring CPAP machine on DOS.  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  - No active airway disease. No orthopnea.  Endocrine - Morbid obesity w BMI of 41.63.  Renal/GU  - Cr. 2.0 on 03/29/2021. Stage 4 CKD.  - Urge incontinence - on Sanctura.   Hematology  - Anemia - Hgb 12.2 wnl on 03/29/21. Repeat CBC today notable for  anemia w Hgb of 11.4.   - Thrombocytopenia - Plt 112 on 05/29/20.  Repeat CBC today notable for improved platelets at 131.  Anticoagulation/Antiplatelets: none.  Frailty: CFS 3, managing well.  The CFS provides a score from 1 to 9, with higher scores indicating greater frailty. Multiple studies have found a correlation between greater frailty with morbidity and mortality after surgery.   Appropriateness of surgery location: [facility] OR.   CODING Patient has been evaluated for anesthesia risk prior to procedure. Medical records, medications, labs and pertinent testing reviewed. Tests ordered - see above.   Overall Assessment No further medical assessment or optimization required to proceeding with procedure. The patient has no outstanding questions or concerns.    Instructions for day of procedure, importance of adhering to NPO guidelines, and pain management discussed with written instruction given to patient. The Patient verbalized understanding.    The  patient was given the opportunity to ask questions, all of which were answered. The patient is satisfied with the anesthesia plan. After discussing the risks and benefits of the anesthesia, the patient was provided with an E-consent.   SYLVIE KIAKU HILL, PA-C  Day of Surgery  Day of Surgery Exam

## 2024-03-13 HISTORY — PX: OTHER SURGICAL HISTORY: SHX169

## 2024-03-16 NOTE — Discharge Summary (Signed)
 T[facility] Discharge Summary  PATIENT: Becky Little DATE OF ADMISSION:  03/13/2024 DATE OF DISCHARGE:  03/16/2024 ATTENDING: Alm Mall, MD Consulting Physicians: None   DIAGNOSES: Patient Active Problem List  Diagnosis  . Malignant neoplasm of lower lobe of left lung (CMS/HHS-HCC)  . Preoperative evaluation to rule out surgical contraindication      HISTORY OF PRESENT ILLNESS:  Becky Little is a 53 y.o. female with HTN, 15py smoking, HLD, milk AS/AI, CKD, PTSD/bipol,ar/depression, and NSCLC of LLL s/p neoadj chemo here for resection   PAST MEDICAL HISTORY: Past Medical History:  Diagnosis Date  . Anemia   . Awareness under anesthesia   . Depression   . Hypertension   . Migraine   . Preoperative evaluation to rule out surgical contraindication 02/19/2024    PAST SURGICAL HISTORY: Past Surgical History:  Procedure Laterality Date  . ARTHROSCOPIC ROTATOR CUFF REPAIR Right 02/2023  . THORACOSCOPY WITH REMOVAL OF A SINGLE LUNG SEGMENT Left 03/13/2024   Procedure: THORACOSCOPY, SURGICAL; WITH REMOVAL OF A SINGLE LUNG SEGMENT(SEGMENTECTOMY);  Surgeon: Mall Alm Helayne Mickey., MD;  Location: [facility];  Service: Cardiothoracic;  Laterality: Left;  . THORACOSCOPY WITH MEDIASTINAL AND REGIONAL LYMPHADENECTOMY Left 03/13/2024   Procedure: THORACOSCOPY, SURGICAL; WITH MEDIASTINAL AND REGIONAL LYMPHADENECTOMY (LIST IN ADDITION TO PRIMARY PROCEDURE);  Surgeon: Mall Alm Helayne Mickey., MD;  Location: [facility];  Service: Cardiothoracic;  Laterality: Left;  . OTHER SURGERY Bilateral    Carpel Tunnel release    FAMILY HISTORY: Family History  Problem Relation Name Age of Onset  . High blood pressure (Hypertension) Mother    . COPD Father    . Heart failure Father       SOCIAL HISTORY: Social History   Tobacco Use  . Smoking status: Former    Current packs/day: 0.00    Average packs/day: 0.3 packs/day for 35.0 years (8.8 ttl pk-yrs)    Types: Cigarettes     Start date: 37    Quit date: 2022    Years since quitting: 3.8  . Smokeless tobacco: Never  Substance Use Topics  . Alcohol use: Not on file    ALLERGIES: No Known Allergies  MEDICATIONS ON ADMISSION: Prior to Admission Medications  Prescriptions Last Dose Taking?  ARIPiprazole (ABILIFY) 30 MG tablet 03/13/2024 Morning Yes  Sig: Take 15 mg by mouth once daily  atorvastatin (LIPITOR) 20 MG tablet 03/12/2024 Yes  Sig: Take 10 mg by mouth at bedtime  buPROPion (WELLBUTRIN XL) 150 MG XL tablet 03/13/2024 Morning Yes  Sig: Take 450 mg by mouth every morning  calcium citrate-vitamin D3 (CITRACAL+D PETITES) 200 mg-6.25 mcg (250 unit) tablet 03/12/2024 Yes  Sig: Take 1 tablet by mouth 2 (two) times daily  cetirizine (ZYRTEC) 10 MG tablet 03/13/2024 Morning Yes  Sig: Take 10 mg by mouth once daily  cholecalciferol (VITAMIN D3) 1000 unit tablet 03/12/2024 Yes  Sig: Take 1,000 Units by mouth once daily  hydrOXYzine HCL (ATARAX) 10 MG tablet  No  Sig: Take 10 mg by mouth 3 (three) times daily as needed  hydroCHLOROthiazide (HYDRODIURIL) 25 MG tablet 03/12/2024 Yes  Sig: Take 12.5 mg by mouth once daily  lisinopriL (ZESTRIL) 40 MG tablet 03/12/2024 Yes  Sig: Take 40 mg by mouth once daily for Blood Pressure  melatonin 1 mg tablet 03/12/2024 Yes  Sig: Take 3 mg by mouth at bedtime  propranoloL (INDERAL) 80 MG tablet 03/13/2024 Morning Yes  Sig: Take 80 mg by mouth once daily  traZODone (DESYREL) 50 MG tablet 03/12/2024  Yes  Sig: Take 50 mg by mouth at bedtime  trospium (SANCTURA) 20 mg tablet 03/13/2024 Morning Yes  Sig: Take 20 mg by mouth 2 (two) times daily    Facility-Administered Medications: None     PHYSICAL EXAMINATION: General: alert, cooperative, in NAD Cardiovascular: RRR Respiratory: non-labored on Room Air Abdomen: Soft, ND, not TTP. No rebound or guarding. Extremities: No lower extremity edema. Wound: incisions clean/dry/intact. No erythema, drainage or fluctuance.   Psych: Oriented to time, place and person, mood and affect are appropriate   HOSPITAL COURSE: The patient presented to [facility] on 10/23 to undergo surgical intervention for lung cancer.  She was taken to the operating room by Dr. Starlin, who performed a Left VATS Lower lobectomy. Overall, the patient tolerated the procedure well without significant difficulty or evident complication, and was transferred to the recovery room in stable condition.  In the immediate postoperative period, she continued to do well.  As she was doing well, the patient was transitioned to the step-down floor for the remainder of her hospital course.  The patient was ambulating the halls several times per day with minimal assistance.  Chest tube was discontinued on post-operative day 2.  The patient had restored GI function and was tolerating a regular diet.  All surgical incisions were healing well.   Therefore,  once follow-up arrangements were confirmed, the patient was felt suitable for discharge to home on postoperative day 3.  DISCHARGE LABORATORIES: Labs in chart were reviewed.  DISCHARGE MEDICATIONS:   Current Discharge Medication List     ASK your doctor about these medications      Instructions  ARIPiprazole 30 MG tablet Refills: 0  Commonly known as: ABILIFY Take 15 mg by mouth once daily   atorvastatin 20 MG tablet Refills: 0  Commonly known as: LIPITOR Take 10 mg by mouth at bedtime   buPROPion 150 MG XL tablet Refills: 0  Commonly known as: WELLBUTRIN XL Take 450 mg by mouth every morning   calcium citrate-vitamin D3 200 mg-6.25 mcg (250 unit) tablet Refills: 0  Commonly known as: CITRACAL+D PETITES Take 1 tablet by mouth 2 (two) times daily   cetirizine 10 MG tablet Refills: 0  Commonly known as: ZyrTEC Take 10 mg by mouth once daily   cholecalciferol 1000 unit tablet Refills: 0  Commonly known as: VITAMIN D3 Take 1,000 Units by mouth once daily    hydroCHLOROthiazide 25 MG tablet Refills: 0  Commonly known as: HYDRODIURIL Take 12.5 mg by mouth once daily   hydrOXYzine HCL 10 MG tablet Refills: 0  Commonly known as: ATARAX Take 10 mg by mouth 3 (three) times daily as needed   lisinopriL 40 MG tablet Refills: 0  Commonly known as: ZESTRIL Take 40 mg by mouth once daily for Blood Pressure   melatonin 1 mg tablet Refills: 0  Take 3 mg by mouth at bedtime   propranoloL 80 MG tablet Refills: 0  Commonly known as: INDERAL Take 80 mg by mouth once daily   traZODone 50 MG tablet Refills: 0  Commonly known as: DESYREL Take 50 mg by mouth at bedtime   trospium 20 mg tablet Refills: 0  Commonly known as: SANCTURA Take 20 mg by mouth 2 (two) times daily        DISCHARGE INSTRUCTIONS: The patient was instructed to continue on a regular diet.  She was also instructed to continue with light activity restrictions.  She was informed to call the cardiothoracic resident on call  for any questions or concerns; specifically pain, fever, chills, wound redness or drainage, significant difficulty breathing or extremity swelling.  FOLLOWUP APPOINTMENTS: Will be arranged by clinic  Franky Null, MD General Surgery, PGY-5 03/16/2024    ------------------------------------------------------------------------------- Attestation signed by Murlean Lang Fess, MD at 03/17/2024 10:05 AM Attestation Statement:   I personally saw and evaluated the patient, and participated in the management and treatment plan as documented in the resident/fellow note.  JACOB FESS MURLEAN, MD  -------------------------------------------------------------------------------

## 2024-03-25 ENCOUNTER — Emergency Department
Admission: EM | Admit: 2024-03-25 | Discharge: 2024-03-25 | Disposition: A | Discharge status: Home / Self Care | Attending: Emergency Medicine | Admitting: Emergency Medicine

## 2024-03-25 ENCOUNTER — Emergency Department

## 2024-03-25 ENCOUNTER — Other Ambulatory Visit: Payer: Self-pay

## 2024-03-25 DIAGNOSIS — E162 Hypoglycemia, unspecified: Secondary | ICD-10-CM | POA: Insufficient documentation

## 2024-03-25 DIAGNOSIS — I10 Essential (primary) hypertension: Secondary | ICD-10-CM | POA: Diagnosis not present

## 2024-03-25 DIAGNOSIS — D649 Anemia, unspecified: Secondary | ICD-10-CM | POA: Insufficient documentation

## 2024-03-25 DIAGNOSIS — D72819 Decreased white blood cell count, unspecified: Secondary | ICD-10-CM | POA: Diagnosis not present

## 2024-03-25 DIAGNOSIS — Z85118 Personal history of other malignant neoplasm of bronchus and lung: Secondary | ICD-10-CM | POA: Insufficient documentation

## 2024-03-25 HISTORY — DX: Malignant (primary) neoplasm, unspecified: C80.1

## 2024-03-25 LAB — CBC
HCT: 35.7 % — ABNORMAL LOW (ref 36.0–46.0)
Hemoglobin: 10.3 g/dL — ABNORMAL LOW (ref 12.0–15.0)
MCH: 21.3 pg — ABNORMAL LOW (ref 26.0–34.0)
MCHC: 28.9 g/dL — ABNORMAL LOW (ref 30.0–36.0)
MCV: 73.8 fL — ABNORMAL LOW (ref 80.0–100.0)
Platelets: 217 K/uL (ref 150–400)
RBC: 4.84 MIL/uL (ref 3.87–5.11)
RDW: 13.2 % (ref 11.5–15.5)
WBC: 3.7 K/uL — ABNORMAL LOW (ref 4.0–10.5)
nRBC: 0 % (ref 0.0–0.2)

## 2024-03-25 LAB — DIFFERENTIAL
Abs Immature Granulocytes: 0.01 K/uL (ref 0.00–0.07)
Basophils Absolute: 0 K/uL (ref 0.0–0.1)
Basophils Relative: 1 %
Eosinophils Absolute: 0 K/uL (ref 0.0–0.5)
Eosinophils Relative: 0 %
Immature Granulocytes: 0 %
Lymphocytes Relative: 31 %
Lymphs Abs: 1.2 K/uL (ref 0.7–4.0)
Monocytes Absolute: 0.4 K/uL (ref 0.1–1.0)
Monocytes Relative: 10 %
Neutro Abs: 2.1 K/uL (ref 1.7–7.7)
Neutrophils Relative %: 58 %

## 2024-03-25 LAB — COMPREHENSIVE METABOLIC PANEL WITH GFR
ALT: 19 U/L (ref 0–44)
AST: 19 U/L (ref 15–41)
Albumin: 3.4 g/dL — ABNORMAL LOW (ref 3.5–5.0)
Alkaline Phosphatase: 84 U/L (ref 38–126)
Anion gap: 8 (ref 5–15)
BUN: 22 mg/dL — ABNORMAL HIGH (ref 6–20)
CO2: 25 mmol/L (ref 22–32)
Calcium: 9.4 mg/dL (ref 8.9–10.3)
Chloride: 104 mmol/L (ref 98–111)
Creatinine, Ser: 2.24 mg/dL — ABNORMAL HIGH (ref 0.44–1.00)
GFR, Estimated: 25 mL/min — ABNORMAL LOW (ref >60)
Glucose, Bld: 94 mg/dL (ref 70–99)
Potassium: 4.4 mmol/L (ref 3.5–5.1)
Sodium: 137 mmol/L (ref 135–145)
Total Bilirubin: 0.5 mg/dL (ref 0.0–1.2)
Total Protein: 7.1 g/dL (ref 6.5–8.1)

## 2024-03-25 LAB — URINALYSIS, ROUTINE W REFLEX MICROSCOPIC
Bilirubin Urine: NEGATIVE
Glucose, UA: NEGATIVE mg/dL
Hgb urine dipstick: NEGATIVE
Ketones, ur: NEGATIVE mg/dL
Leukocytes,Ua: NEGATIVE
Nitrite: NEGATIVE
Protein, ur: NEGATIVE mg/dL
Specific Gravity, Urine: 1.004 — ABNORMAL LOW (ref 1.005–1.030)
pH: 7 (ref 5.0–8.0)

## 2024-03-25 LAB — CBG MONITORING, ED
Glucose-Capillary: 41 mg/dL — CL (ref 70–99)
Glucose-Capillary: 110 mg/dL — ABNORMAL HIGH (ref 70–99)
Glucose-Capillary: 94 mg/dL (ref 70–99)

## 2024-03-25 LAB — PROTIME-INR
INR: 1 (ref 0.8–1.2)
Prothrombin Time: 14.1 s (ref 11.4–15.2)

## 2024-03-25 LAB — ETHANOL: Alcohol, Ethyl (B): <15 mg/dL (ref <15)

## 2024-03-25 LAB — APTT: aPTT: 33 s (ref 24–36)

## 2024-03-25 NOTE — ED Notes (Signed)
 Pt given protein shake

## 2024-03-25 NOTE — ED Notes (Signed)
 Patient transported to X-ray

## 2024-03-25 NOTE — ED Notes (Signed)
 Patient transported to CT

## 2024-03-25 NOTE — ED Triage Notes (Signed)
 Pt to ED from home [facility] , family called for slurred speech and AMS lasting about 15 minutes. Pt was slightly altered and slow to respond upon EMS arrival but no slurred speech  En route: normal speech, alert and oriented X4, GCS15, no unilateral weakness, stroke screen was negative X2, ambulatory on scene  Pt states she had lack of coordination to R hand at the same time she had slurred speech and she knew something wasn't right.  Hx lung cancer, 40% of L lower lung removed on 10/23  CBG 68, 96% on RA, 97.6, 125/93, HR 71, 12 Has 20# to R [facility]  In triage: speech is clear. No arm drift. Pt is oriented. No aphasia.   CBG is 41. Giving orange juice now.

## 2024-03-25 NOTE — ED Provider Notes (Signed)
 [facility] Provider Note    Event Date/Time   First MD Initiated Contact with Patient 03/25/24 1510     (approximate)   History   Chief Complaint Aphasia   HPI  Becky Little is a 53 y.o. female with past medical history of hypertension, migraines, lung cancer, and complex regional pain syndrome who presents to the ED complaining of slurred speech.  Spouse at bedside reports that patient was having difficulty communicating with and getting money out to pay a repair man who had come to their home around 2 PM this afternoon.  Patient stated to her spouse that she felt off and their son contacted EMS when he noticed that patient was having difficulty speaking.  On arrival to the ED, patient noted to have CBG of 41, has since had some orange juice and a protein shake, reports feeling much better.  Spouse at bedside reports patient is back to normal with no difficulty speaking.  Patient denies any loss of consciousness with the episode and did not have any vision changes, numbness, or weakness.  She had been feeling well throughout the day, denies any history of diabetes.  She had some toast and jelly for breakfast, but had not yet had lunch.  She has a chronic cough related to lung cancer and recent resection, but denies any fevers, productive cough, chest pain, shortness of breath.  She also has not had any nausea, vomiting, diarrhea, or dysuria.     Physical Exam   Triage Vital Signs: ED Triage Vitals  Encounter Vitals Group     BP 03/25/24 1459 (!) 116/56     Girls Systolic BP Percentile --      Girls Diastolic BP Percentile --      Boys Systolic BP Percentile --      Boys Diastolic BP Percentile --      Pulse Rate 03/25/24 1459 71     Resp 03/25/24 1459 20     Temp 03/25/24 1459 97.9 F (36.6 C)     Temp Source 03/25/24 1459 Oral     SpO2 03/25/24 1508 100 %     Weight 03/25/24 1502 218 lb (98.9 kg)     Height 03/25/24 1502 5' (1.524 m)     Head  Circumference --      Peak Flow --      Pain Score 03/25/24 1501 0     Pain Loc --      Pain Education --      Exclude from Growth Chart --     Most recent vital signs: Vitals:   03/25/24 1625 03/25/24 1827  BP: 97/71 91/74  Pulse: 71 78  Resp: 16 16  Temp:    SpO2: 100% 100%    Constitutional: Alert and oriented. Eyes: Conjunctivae are normal. Head: Atraumatic. Nose: No congestion/rhinnorhea. Mouth/Throat: Mucous membranes are moist.  Cardiovascular: Normal rate, regular rhythm. Grossly normal heart sounds.  2+ radial pulses bilaterally. Respiratory: Normal respiratory effort.  No retractions. Lungs CTAB. Gastrointestinal: Soft and nontender. No distention. Musculoskeletal: No lower extremity tenderness nor edema.  Neurologic:  Normal speech and language. No gross focal neurologic deficits are appreciated.    ED Results / Procedures / Treatments   Labs (all labs ordered are listed, but only abnormal results are displayed) Labs Reviewed  CBC - Abnormal; Notable for the following components:      Result Value   WBC 3.7 (*)    Hemoglobin 10.3 (*)    HCT  35.7 (*)    MCV 73.8 (*)    MCH 21.3 (*)    MCHC 28.9 (*)    All other components within normal limits  COMPREHENSIVE METABOLIC PANEL WITH GFR - Abnormal; Notable for the following components:   BUN 22 (*)    Creatinine, Ser 2.24 (*)    Albumin 3.4 (*)    GFR, Estimated 25 (*)    All other components within normal limits  URINALYSIS, ROUTINE W REFLEX MICROSCOPIC - Abnormal; Notable for the following components:   Color, Urine STRAW (*)    APPearance CLEAR (*)    Specific Gravity, Urine 1.004 (*)    All other components within normal limits  CBG MONITORING, ED - Abnormal; Notable for the following components:   Glucose-Capillary 41 (*)    All other components within normal limits  CBG MONITORING, ED - Abnormal; Notable for the following components:   Glucose-Capillary 110 (*)    All other components within  normal limits  PROTIME-INR  APTT  DIFFERENTIAL  ETHANOL  CBG MONITORING, ED     EKG  ED ECG REPORT I, Carlin Palin, the attending physician, personally viewed and interpreted this ECG.   Date: 03/25/2024  EKG Time: 15:38  Rate: 72  Rhythm: normal sinus rhythm  Axis: Normal  Intervals:none  ST&T Change: None  RADIOLOGY Chest x-ray reviewed and interpreted by me with questionable left apical pneumothorax and left lung base infiltrate.  PROCEDURES:  Critical Care performed: No  Procedures   MEDICATIONS ORDERED IN ED: Medications - No data to display   IMPRESSION / MDM / ASSESSMENT AND PLAN / ED COURSE  I reviewed the triage vital signs and the nursing notes.                              53 y.o. female with past medical history of hypertension, migraines, lung cancer, and complex regional pain syndrome who presents to the ED complaining of episode of slurred speech at home, found to be hypoglycemic here in the ED.   Patient's presentation is most consistent with acute presentation with potential threat to life or bodily function.  Differential diagnosis includes, but is not limited to, stroke, TIA, hypoglycemia, sepsis, UTI, pneumonia, anemia, electrolyte abnormality, AKI.  Patient nontoxic-appearing and in no acute distress, vital signs are unremarkable.  She is awake and alert at this time with a nonfocal neurologic exam, symptoms seem to have resolved after she had some juice and a protein shake.  We will recheck her CBG, assess for infectious process with chest x-ray and urinalysis.  Additional labs pending at this time and we will monitor serial CBG.  Labs show renal function stable compared to previous without acute electrolyte abnormality.  She has mild leukopenia and anemia comparable to labs recently drawn at [facility].  Urinalysis shows no signs of infection, chest x-ray with questionable apical pneumothorax and left lower lobe infiltrate.  This was further  assessed with CT imaging, which shows small hydropneumothorax, expected postoperatively following the patient's recent lobectomy.  Serial CBGs have been reassuring, no evidence of sepsis.  Patient appropriate for discharge home with outpatient follow-up with PCP and cardiothoracic surgery at [facility].  She was counseled to return to the ED for new or worsening symptoms, patient agrees with plan.      FINAL CLINICAL IMPRESSION(S) / ED DIAGNOSES   Final diagnoses:  Hypoglycemia     Rx / DC Orders   ED Discharge  Orders     None        Note:  This document was prepared using Dragon voice recognition software and may include unintentional dictation errors.   Willo Dunnings, MD 03/25/24 [phone]
